# Patient Record
Sex: Male | Born: 1965 | Race: White | Hispanic: No | Marital: Married | Smoking: Current every day smoker
Health system: Southern US, Community
[De-identification: ages and names within clinical notes are randomized; demographics above are authoritative.]

## PROBLEM LIST (undated history)

## (undated) DIAGNOSIS — Z720 Tobacco use: Secondary | ICD-10-CM

## (undated) HISTORY — PX: KNEE ARTHROSCOPY: SUR90

## (undated) HISTORY — PX: REPLACEMENT TOTAL KNEE: SUR1224

---

## 2018-11-22 ENCOUNTER — Encounter (HOSPITAL_COMMUNITY)
Admission: EM | Disposition: A | Discharge status: Home / Self Care | Payer: Self-pay | Source: Home / Self Care | Attending: Internal Medicine

## 2018-11-22 ENCOUNTER — Inpatient Hospital Stay (HOSPITAL_COMMUNITY): Payer: BC Managed Care – PPO

## 2018-11-22 ENCOUNTER — Emergency Department (HOSPITAL_COMMUNITY): Payer: BC Managed Care – PPO

## 2018-11-22 ENCOUNTER — Other Ambulatory Visit: Payer: Self-pay

## 2018-11-22 ENCOUNTER — Encounter (HOSPITAL_COMMUNITY): Payer: Self-pay

## 2018-11-22 ENCOUNTER — Inpatient Hospital Stay (HOSPITAL_COMMUNITY): Payer: BC Managed Care – PPO | Admitting: Anesthesiology

## 2018-11-22 ENCOUNTER — Inpatient Hospital Stay (HOSPITAL_COMMUNITY)
Admission: EM | Admit: 2018-11-22 | Discharge: 2018-11-25 | DRG: 956 | Disposition: A | Discharge status: Home / Self Care | Payer: BC Managed Care – PPO | Attending: Internal Medicine | Admitting: Internal Medicine

## 2018-11-22 DIAGNOSIS — S72001A Fracture of unspecified part of neck of right femur, initial encounter for closed fracture: Secondary | ICD-10-CM

## 2018-11-22 DIAGNOSIS — S72061A Displaced articular fracture of head of right femur, initial encounter for closed fracture: Principal | ICD-10-CM | POA: Diagnosis present

## 2018-11-22 DIAGNOSIS — T1490XA Injury, unspecified, initial encounter: Secondary | ICD-10-CM

## 2018-11-22 DIAGNOSIS — Y92415 Exit ramp or entrance ramp of street or highway as the place of occurrence of the external cause: Secondary | ICD-10-CM | POA: Diagnosis not present

## 2018-11-22 DIAGNOSIS — S72001B Fracture of unspecified part of neck of right femur, initial encounter for open fracture type I or II: Secondary | ICD-10-CM | POA: Diagnosis not present

## 2018-11-22 DIAGNOSIS — Z820 Family history of epilepsy and other diseases of the nervous system: Secondary | ICD-10-CM

## 2018-11-22 DIAGNOSIS — M9689 Other intraoperative and postprocedural complications and disorders of the musculoskeletal system: Secondary | ICD-10-CM | POA: Diagnosis not present

## 2018-11-22 DIAGNOSIS — T148XXA Other injury of unspecified body region, initial encounter: Secondary | ICD-10-CM | POA: Diagnosis not present

## 2018-11-22 DIAGNOSIS — Z96659 Presence of unspecified artificial knee joint: Secondary | ICD-10-CM | POA: Diagnosis present

## 2018-11-22 DIAGNOSIS — S72059A Unspecified fracture of head of unspecified femur, initial encounter for closed fracture: Secondary | ICD-10-CM

## 2018-11-22 DIAGNOSIS — Y92234 Operating room of hospital as the place of occurrence of the external cause: Secondary | ICD-10-CM | POA: Diagnosis not present

## 2018-11-22 DIAGNOSIS — F17210 Nicotine dependence, cigarettes, uncomplicated: Secondary | ICD-10-CM | POA: Diagnosis present

## 2018-11-22 DIAGNOSIS — Z23 Encounter for immunization: Secondary | ICD-10-CM | POA: Diagnosis not present

## 2018-11-22 DIAGNOSIS — R17 Unspecified jaundice: Secondary | ICD-10-CM | POA: Diagnosis present

## 2018-11-22 DIAGNOSIS — Z880 Allergy status to penicillin: Secondary | ICD-10-CM | POA: Diagnosis not present

## 2018-11-22 DIAGNOSIS — S50811A Abrasion of right forearm, initial encounter: Secondary | ICD-10-CM | POA: Diagnosis present

## 2018-11-22 DIAGNOSIS — R402412 Glasgow coma scale score 13-15, at arrival to emergency department: Secondary | ICD-10-CM | POA: Diagnosis present

## 2018-11-22 DIAGNOSIS — S32421A Displaced fracture of posterior wall of right acetabulum, initial encounter for closed fracture: Secondary | ICD-10-CM

## 2018-11-22 DIAGNOSIS — Z5321 Procedure and treatment not carried out due to patient leaving prior to being seen by health care provider: Secondary | ICD-10-CM | POA: Diagnosis not present

## 2018-11-22 DIAGNOSIS — Z96641 Presence of right artificial hip joint: Secondary | ICD-10-CM | POA: Diagnosis not present

## 2018-11-22 DIAGNOSIS — Y838 Other surgical procedures as the cause of abnormal reaction of the patient, or of later complication, without mention of misadventure at the time of the procedure: Secondary | ICD-10-CM | POA: Diagnosis not present

## 2018-11-22 DIAGNOSIS — S73004A Unspecified dislocation of right hip, initial encounter: Secondary | ICD-10-CM

## 2018-11-22 DIAGNOSIS — Z720 Tobacco use: Secondary | ICD-10-CM | POA: Diagnosis not present

## 2018-11-22 DIAGNOSIS — I10 Essential (primary) hypertension: Secondary | ICD-10-CM

## 2018-11-22 DIAGNOSIS — Z9889 Other specified postprocedural states: Secondary | ICD-10-CM | POA: Diagnosis not present

## 2018-11-22 DIAGNOSIS — Y93C2 Activity, hand held interactive electronic device: Secondary | ICD-10-CM

## 2018-11-22 DIAGNOSIS — R739 Hyperglycemia, unspecified: Secondary | ICD-10-CM

## 2018-11-22 DIAGNOSIS — Z419 Encounter for procedure for purposes other than remedying health state, unspecified: Secondary | ICD-10-CM

## 2018-11-22 DIAGNOSIS — R509 Fever, unspecified: Secondary | ICD-10-CM | POA: Diagnosis present

## 2018-11-22 DIAGNOSIS — Z20828 Contact with and (suspected) exposure to other viral communicable diseases: Secondary | ICD-10-CM | POA: Diagnosis present

## 2018-11-22 HISTORY — PX: HIP CLOSED REDUCTION: SHX983

## 2018-11-22 HISTORY — DX: Tobacco use: Z72.0

## 2018-11-22 LAB — CBC
HCT: 42 % (ref 39.0–52.0)
Hemoglobin: 15 g/dL (ref 13.0–17.0)
MCH: 33.8 pg (ref 26.0–34.0)
MCHC: 35.7 g/dL (ref 30.0–36.0)
MCV: 94.6 fL (ref 80.0–100.0)
Platelets: 296 10*3/uL (ref 150–400)
RBC: 4.44 MIL/uL (ref 4.22–5.81)
RDW: 11.5 % (ref 11.5–15.5)
WBC: 5 10*3/uL (ref 4.0–10.5)
nRBC: 0 % (ref 0.0–0.2)

## 2018-11-22 LAB — COMPREHENSIVE METABOLIC PANEL
ALT: 24 U/L (ref 0–44)
AST: 20 U/L (ref 15–41)
Albumin: 3.9 g/dL (ref 3.5–5.0)
Alkaline Phosphatase: 69 U/L (ref 38–126)
Anion gap: 9 (ref 5–15)
BUN: 27 mg/dL — ABNORMAL HIGH (ref 6–20)
CO2: 23 mmol/L (ref 22–32)
Calcium: 8.9 mg/dL (ref 8.9–10.3)
Chloride: 107 mmol/L (ref 98–111)
Creatinine, Ser: 0.87 mg/dL (ref 0.61–1.24)
GFR calc Af Amer: >60 mL/min (ref >60)
GFR calc non Af Amer: >60 mL/min (ref >60)
Glucose, Bld: 139 mg/dL — ABNORMAL HIGH (ref 70–99)
Potassium: 4.1 mmol/L (ref 3.5–5.1)
Sodium: 139 mmol/L (ref 135–145)
Total Bilirubin: 2 mg/dL — ABNORMAL HIGH (ref 0.3–1.2)
Total Protein: 6.3 g/dL — ABNORMAL LOW (ref 6.5–8.1)

## 2018-11-22 LAB — I-STAT CHEM 8, ED
BUN: 29 mg/dL — ABNORMAL HIGH (ref 6–20)
Calcium, Ion: 1.18 mmol/L (ref 1.15–1.40)
Chloride: 105 mmol/L (ref 98–111)
Creatinine, Ser: 0.8 mg/dL (ref 0.61–1.24)
Glucose, Bld: 136 mg/dL — ABNORMAL HIGH (ref 70–99)
HCT: 42 % (ref 39.0–52.0)
Hemoglobin: 14.3 g/dL (ref 13.0–17.0)
Potassium: 4.1 mmol/L (ref 3.5–5.1)
Sodium: 140 mmol/L (ref 135–145)
TCO2: 23 mmol/L (ref 22–32)

## 2018-11-22 LAB — TYPE AND SCREEN
ABO/RH(D): O POS
Antibody Screen: NEGATIVE

## 2018-11-22 LAB — URINALYSIS, ROUTINE W REFLEX MICROSCOPIC
Bilirubin Urine: NEGATIVE
Glucose, UA: NEGATIVE mg/dL
Hgb urine dipstick: NEGATIVE
Ketones, ur: NEGATIVE mg/dL
Leukocytes,Ua: NEGATIVE
Nitrite: NEGATIVE
Protein, ur: NEGATIVE mg/dL
Specific Gravity, Urine: 1.039 — ABNORMAL HIGH (ref 1.005–1.030)
pH: 6 (ref 5.0–8.0)

## 2018-11-22 LAB — ABO/RH: ABO/RH(D): O POS

## 2018-11-22 LAB — PROTIME-INR
INR: 1 (ref 0.8–1.2)
Prothrombin Time: 13 seconds (ref 11.4–15.2)

## 2018-11-22 LAB — LACTIC ACID, PLASMA: Lactic Acid, Venous: 1.4 mmol/L (ref 0.5–1.9)

## 2018-11-22 LAB — HIV ANTIBODY (ROUTINE TESTING W REFLEX): HIV Screen 4th Generation wRfx: NONREACTIVE

## 2018-11-22 LAB — CDS SEROLOGY

## 2018-11-22 LAB — ETHANOL: Alcohol, Ethyl (B): <10 mg/dL (ref <10)

## 2018-11-22 LAB — VITAMIN D 25 HYDROXY (VIT D DEFICIENCY, FRACTURES): Vit D, 25-Hydroxy: 25.26 ng/mL — ABNORMAL LOW (ref 30–100)

## 2018-11-22 LAB — SARS CORONAVIRUS 2 BY RT PCR (HOSPITAL ORDER, PERFORMED IN ~~LOC~~ HOSPITAL LAB): SARS Coronavirus 2: NEGATIVE

## 2018-11-22 SURGERY — CLOSED REDUCTION, HIP
Anesthesia: General | Site: Hip | Laterality: Right

## 2018-11-22 MED ORDER — LACTATED RINGERS IV SOLN
INTRAVENOUS | Status: DC | PRN
  Administered 2018-11-22: 14:00:00 via INTRAVENOUS

## 2018-11-22 MED ORDER — ONDANSETRON HCL 4 MG/2ML IJ SOLN: 4.0000 mg | Freq: Once | INTRAMUSCULAR | Status: DC

## 2018-11-22 MED ORDER — HYDROMORPHONE HCL 1 MG/ML IJ SOLN: 0.5000 mg | Freq: Once | INTRAMUSCULAR | Status: DC

## 2018-11-22 MED ORDER — ACETAMINOPHEN 500 MG PO TABS
ORAL_TABLET | ORAL | Status: AC
  Administered 2018-11-22: 1000 mg via ORAL
  Filled 2018-11-22: qty 2

## 2018-11-22 MED ORDER — OXYCODONE HCL 5 MG/5ML PO SOLN: 5.0000 mg | Freq: Once | ORAL | Status: DC | PRN

## 2018-11-22 MED ORDER — PROMETHAZINE HCL 25 MG/ML IJ SOLN: 6.2500 mg | INTRAMUSCULAR | Status: DC | PRN

## 2018-11-22 MED ORDER — PROPOFOL 10 MG/ML IV BOLUS
INTRAVENOUS | Status: DC | PRN
  Administered 2018-11-22: 200 mg via INTRAVENOUS

## 2018-11-22 MED ORDER — OXYCODONE HCL 5 MG PO TABS
5.0000 mg | ORAL_TABLET | ORAL | Status: DC | PRN
  Administered 2018-11-22: 5 mg via ORAL
  Administered 2018-11-23 – 2018-11-24 (×3): 10 mg via ORAL
  Administered 2018-11-25: 5 mg via ORAL
  Filled 2018-11-22: qty 2
  Filled 2018-11-22: qty 1
  Filled 2018-11-22 (×2): qty 2
  Filled 2018-11-22: qty 1

## 2018-11-22 MED ORDER — TETANUS-DIPHTH-ACELL PERTUSSIS 5-2.5-18.5 LF-MCG/0.5 IM SUSP
0.5000 mL | Freq: Once | INTRAMUSCULAR | Status: AC
  Administered 2018-11-22: 0.5 mL via INTRAMUSCULAR
  Filled 2018-11-22: qty 0.5

## 2018-11-22 MED ORDER — CLINDAMYCIN PHOSPHATE 900 MG/50ML IV SOLN
900.0000 mg | INTRAVENOUS | Status: AC
  Administered 2018-11-22: 900 mg via INTRAVENOUS
  Filled 2018-11-22: qty 50

## 2018-11-22 MED ORDER — MIDAZOLAM HCL 2 MG/2ML IJ SOLN
INTRAMUSCULAR | Status: AC
  Filled 2018-11-22: qty 2

## 2018-11-22 MED ORDER — DEXAMETHASONE SODIUM PHOSPHATE 10 MG/ML IJ SOLN
INTRAMUSCULAR | Status: AC
  Filled 2018-11-22: qty 1

## 2018-11-22 MED ORDER — IOHEXOL 300 MG/ML  SOLN
100.0000 mL | Freq: Once | INTRAMUSCULAR | Status: AC | PRN
  Administered 2018-11-22: 100 mL via INTRAVENOUS

## 2018-11-22 MED ORDER — ACETAMINOPHEN 500 MG PO TABS
1000.0000 mg | ORAL_TABLET | Freq: Four times a day (QID) | ORAL | Status: DC
  Administered 2018-11-22 – 2018-11-24 (×4): 1000 mg via ORAL
  Filled 2018-11-22 (×5): qty 2

## 2018-11-22 MED ORDER — FENTANYL CITRATE (PF) 100 MCG/2ML IJ SOLN
INTRAMUSCULAR | Status: DC | PRN
  Administered 2018-11-22 (×4): 50 ug via INTRAVENOUS

## 2018-11-22 MED ORDER — ACETAMINOPHEN 500 MG PO TABS
1000.0000 mg | ORAL_TABLET | Freq: Once | ORAL | Status: AC
  Administered 2018-11-22: 13:00:00 1000 mg via ORAL

## 2018-11-22 MED ORDER — FENTANYL CITRATE (PF) 250 MCG/5ML IJ SOLN
INTRAMUSCULAR | Status: AC
  Filled 2018-11-22: qty 5

## 2018-11-22 MED ORDER — GABAPENTIN 100 MG PO CAPS
100.0000 mg | ORAL_CAPSULE | Freq: Three times a day (TID) | ORAL | Status: DC
  Administered 2018-11-22 – 2018-11-25 (×6): 100 mg via ORAL
  Filled 2018-11-22 (×7): qty 1

## 2018-11-22 MED ORDER — LACTATED RINGERS IV SOLN
Freq: Once | INTRAVENOUS | Status: AC
  Administered 2018-11-22: 13:00:00 via INTRAVENOUS

## 2018-11-22 MED ORDER — LIDOCAINE 2% (20 MG/ML) 5 ML SYRINGE
INTRAMUSCULAR | Status: AC
  Filled 2018-11-22: qty 5

## 2018-11-22 MED ORDER — LISINOPRIL 10 MG PO TABS
10.0000 mg | ORAL_TABLET | Freq: Every day | ORAL | Status: DC
  Administered 2018-11-23 – 2018-11-25 (×2): 10 mg via ORAL
  Filled 2018-11-22 (×2): qty 1

## 2018-11-22 MED ORDER — CHLORHEXIDINE GLUCONATE 4 % EX LIQD: 60.0000 mL | Freq: Once | CUTANEOUS | Status: DC

## 2018-11-22 MED ORDER — ONDANSETRON HCL 4 MG/2ML IJ SOLN
INTRAMUSCULAR | Status: AC
  Filled 2018-11-22: qty 2

## 2018-11-22 MED ORDER — METHOCARBAMOL 500 MG PO TABS
500.0000 mg | ORAL_TABLET | Freq: Four times a day (QID) | ORAL | Status: DC | PRN
  Administered 2018-11-23 – 2018-11-25 (×3): 500 mg via ORAL
  Filled 2018-11-22 (×3): qty 1

## 2018-11-22 MED ORDER — CEFAZOLIN SODIUM-DEXTROSE 2-4 GM/100ML-% IV SOLN
INTRAVENOUS | Status: AC
  Filled 2018-11-22: qty 100

## 2018-11-22 MED ORDER — ENOXAPARIN SODIUM 40 MG/0.4ML ~~LOC~~ SOLN
40.0000 mg | SUBCUTANEOUS | Status: DC
  Administered 2018-11-22 – 2018-11-23 (×2): 40 mg via SUBCUTANEOUS
  Filled 2018-11-22 (×3): qty 0.4

## 2018-11-22 MED ORDER — HYDROMORPHONE HCL 1 MG/ML IJ SOLN
0.5000 mg | Freq: Once | INTRAMUSCULAR | Status: AC
  Administered 2018-11-22: 0.5 mg via INTRAVENOUS
  Filled 2018-11-22: qty 1

## 2018-11-22 MED ORDER — HYDROMORPHONE HCL 1 MG/ML IJ SOLN
1.0000 mg | INTRAMUSCULAR | Status: DC | PRN
  Administered 2018-11-22: 1 mg via INTRAVENOUS
  Filled 2018-11-22: qty 1

## 2018-11-22 MED ORDER — LIDOCAINE 2% (20 MG/ML) 5 ML SYRINGE
INTRAMUSCULAR | Status: DC | PRN
  Administered 2018-11-22: 100 mg via INTRAVENOUS

## 2018-11-22 MED ORDER — POVIDONE-IODINE 10 % EX SWAB: 2.0000 "application " | Freq: Once | CUTANEOUS | Status: DC

## 2018-11-22 MED ORDER — EPHEDRINE SULFATE-NACL 50-0.9 MG/10ML-% IV SOSY
PREFILLED_SYRINGE | INTRAVENOUS | Status: DC | PRN
  Administered 2018-11-22 (×2): 10 mg via INTRAVENOUS

## 2018-11-22 MED ORDER — DEXAMETHASONE SODIUM PHOSPHATE 10 MG/ML IJ SOLN
INTRAMUSCULAR | Status: DC | PRN
  Administered 2018-11-22: 10 mg via INTRAVENOUS

## 2018-11-22 MED ORDER — MIDAZOLAM HCL 5 MG/5ML IJ SOLN
INTRAMUSCULAR | Status: DC | PRN
  Administered 2018-11-22: 2 mg via INTRAVENOUS

## 2018-11-22 MED ORDER — HYDROMORPHONE HCL 1 MG/ML IJ SOLN
INTRAMUSCULAR | Status: AC
  Filled 2018-11-22: qty 1

## 2018-11-22 MED ORDER — ONDANSETRON HCL 4 MG/2ML IJ SOLN
4.0000 mg | Freq: Once | INTRAMUSCULAR | Status: AC
  Administered 2018-11-22: 4 mg via INTRAVENOUS
  Filled 2018-11-22: qty 2

## 2018-11-22 MED ORDER — OXYCODONE HCL 5 MG PO TABS: 5.0000 mg | ORAL_TABLET | Freq: Once | ORAL | Status: DC | PRN

## 2018-11-22 MED ORDER — HYDROMORPHONE HCL 1 MG/ML IJ SOLN
1.0000 mg | INTRAMUSCULAR | Status: DC | PRN
  Administered 2018-11-23 – 2018-11-25 (×3): 1 mg via INTRAVENOUS
  Filled 2018-11-22 (×3): qty 1

## 2018-11-22 MED ORDER — NICOTINE 21 MG/24HR TD PT24: 21.0000 mg | MEDICATED_PATCH | Freq: Every day | TRANSDERMAL | Status: DC | PRN

## 2018-11-22 MED ORDER — HYDROMORPHONE HCL 1 MG/ML IJ SOLN
1.0000 mg | Freq: Once | INTRAMUSCULAR | Status: AC
  Administered 2018-11-22: 1 mg via INTRAVENOUS
  Filled 2018-11-22: qty 1

## 2018-11-22 MED ORDER — HYDROMORPHONE HCL 1 MG/ML IJ SOLN
0.2500 mg | INTRAMUSCULAR | Status: DC | PRN
  Administered 2018-11-22 (×2): 0.5 mg via INTRAVENOUS

## 2018-11-22 MED ORDER — HYDROCODONE-ACETAMINOPHEN 5-325 MG PO TABS: 1.0000 | ORAL_TABLET | Freq: Four times a day (QID) | ORAL | Status: DC | PRN

## 2018-11-22 MED ORDER — PROPOFOL 10 MG/ML IV BOLUS
INTRAVENOUS | Status: AC
  Filled 2018-11-22: qty 20

## 2018-11-22 MED ORDER — ONDANSETRON HCL 4 MG/2ML IJ SOLN
INTRAMUSCULAR | Status: DC | PRN
  Administered 2018-11-22: 4 mg via INTRAVENOUS

## 2018-11-22 MED ORDER — HYDROMORPHONE HCL 1 MG/ML IJ SOLN
INTRAMUSCULAR | Status: AC | PRN
  Administered 2018-11-22: 0.5 mg via INTRAVENOUS

## 2018-11-22 MED ORDER — SUCCINYLCHOLINE CHLORIDE 200 MG/10ML IV SOSY
PREFILLED_SYRINGE | INTRAVENOUS | Status: AC
  Filled 2018-11-22: qty 10

## 2018-11-22 MED ORDER — CEFAZOLIN SODIUM-DEXTROSE 2-4 GM/100ML-% IV SOLN
2.0000 g | Freq: Three times a day (TID) | INTRAVENOUS | Status: AC
  Administered 2018-11-22 – 2018-11-23 (×2): 2 g via INTRAVENOUS
  Filled 2018-11-22 (×3): qty 100

## 2018-11-22 MED ORDER — MORPHINE SULFATE (PF) 2 MG/ML IV SOLN: 0.5000 mg | INTRAVENOUS | Status: DC | PRN

## 2018-11-22 MED ORDER — SUCCINYLCHOLINE CHLORIDE 200 MG/10ML IV SOSY
PREFILLED_SYRINGE | INTRAVENOUS | Status: DC | PRN
  Administered 2018-11-22: 160 mg via INTRAVENOUS

## 2018-11-22 SURGICAL SUPPLY — 4 items
COVER BACK TABLE 60X90IN (DRAPES) ×3 IMPLANT
DRAPE SURG 17X23 STRL (DRAPES) ×2 IMPLANT
K-WIRE (WIRE) ×2 IMPLANT
TOWEL GREEN STERILE FF (TOWEL DISPOSABLE) ×2 IMPLANT

## 2018-11-22 NOTE — ED Notes (Signed)
Patient transported to CT 
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
## 2018-11-22 NOTE — Transfer of Care (Signed)
Immediate Anesthesia Transfer of Care Note  Patient: Willie Munoz [facility]  Procedure(s) Performed: CLOSED REDUCTION HIP WITH TRACTION PIN PLACEMENT (Right Hip)  Patient Location: PACU  Anesthesia Type:General  Level of Consciousness: oriented, drowsy and patient cooperative  Airway & Oxygen Therapy: Patient Spontanous Breathing and Patient connected to nasal cannula oxygen  Post-op Assessment: Report given to RN and Post -op Vital signs reviewed and stable  Post vital signs: Reviewed  Last Vitals:  Vitals Value Taken Time  BP    Temp    Pulse    Resp    SpO2      Last Pain:  Vitals:   11/22/18 1253  TempSrc:   PainSc: 8          Complications: No apparent anesthesia complications

## 2018-11-22 NOTE — Anesthesia Procedure Notes (Signed)
Procedure Name: Intubation Date/Time: 11/22/2018 1:52 PM Performed by: Jenne Campus, CRNA Pre-anesthesia Checklist: Patient identified, Emergency Drugs available, Suction available and Patient being monitored Patient Re-evaluated:Patient Re-evaluated prior to induction Oxygen Delivery Method: Circle System Utilized Preoxygenation: Pre-oxygenation with 100% oxygen Induction Type: IV induction, Cricoid Pressure applied and Rapid sequence Ventilation: Mask ventilation without difficulty Laryngoscope Size: Miller and 3 Grade View: Grade I Tube type: Oral Tube size: 7.5 mm Number of attempts: 1 Airway Equipment and Method: Stylet and Oral airway Placement Confirmation: ETT inserted through vocal cords under direct vision,  positive ETCO2 and breath sounds checked- equal and bilateral Secured at: 22 cm Tube secured with: Tape Dental Injury: Teeth and Oropharynx as per pre-operative assessment
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
## 2018-11-22 NOTE — Progress Notes (Signed)
Orthopedic Tech Progress Note Patient Details:  Willie Munoz [facility] Dec 15, 1965 [id number]  Musculoskeletal Traction Type of Traction: Skeletal (Balanced Suspension) Traction Location: RLE Traction Weight: 15 lbs   Post Interventions Patient Tolerated: Well Instructions Provided: Care of device   Braulio Bosch 11/22/2018, 3:35 PM

## 2018-11-22 NOTE — Anesthesia Postprocedure Evaluation (Signed)
Anesthesia Post Note  Patient: Willie Munoz  Procedure(s) Performed: CLOSED REDUCTION HIP WITH TRACTION PIN PLACEMENT (Right Hip)     Patient location during evaluation: PACU Anesthesia Type: General Level of consciousness: sedated and patient cooperative Pain management: pain level controlled Vital Signs Assessment: post-procedure vital signs reviewed and stable Respiratory status: spontaneous breathing Cardiovascular status: stable Anesthetic complications: no    Last Vitals:  Vitals:   11/22/18 1507 11/22/18 1522  BP: (!) 149/91 (!) 135/96  Pulse: 67 72  Resp: 13 12  Temp:    SpO2: 97% 98%    Last Pain:  Vitals:   11/22/18 1522  TempSrc:   PainSc: 2     LLE Motor Response: Purposeful movement;Responds to commands (11/22/18 1522) LLE Sensation: Full sensation (11/22/18 1522) RLE Motor Response: Purposeful movement;Responds to commands (11/22/18 1522) RLE Sensation: Full sensation (11/22/18 1522)      Nolon Nations
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
## 2018-11-22 NOTE — ED Notes (Signed)
Pt has returned from CT without incident 
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
## 2018-11-22 NOTE — Social Work (Signed)
CSW acknowledging consult for SNF placement. Will follow for therapy recommendations needed to best determine disposition/for insurance authorization.   Brunilda Eble, MSW, LCSWA [place] Clinical Social Work [phone]   
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
## 2018-11-22 NOTE — ED Notes (Signed)
Help get patient undress on the monitor did ekg shown to Dr Johnney Killian did manual blood pressure it was 150/100 notified RN of blood pressure patient is resting with nurse at bedside

## 2018-11-23 ENCOUNTER — Encounter (HOSPITAL_COMMUNITY): Payer: Self-pay | Admitting: Student

## 2018-11-23 DIAGNOSIS — S72001A Fracture of unspecified part of neck of right femur, initial encounter for closed fracture: Secondary | ICD-10-CM

## 2018-11-23 DIAGNOSIS — I10 Essential (primary) hypertension: Secondary | ICD-10-CM

## 2018-11-23 LAB — COMPREHENSIVE METABOLIC PANEL
ALT: 21 U/L (ref 0–44)
AST: 21 U/L (ref 15–41)
Albumin: 3.5 g/dL (ref 3.5–5.0)
Alkaline Phosphatase: 69 U/L (ref 38–126)
Anion gap: 8 (ref 5–15)
BUN: 26 mg/dL — ABNORMAL HIGH (ref 6–20)
CO2: 25 mmol/L (ref 22–32)
Calcium: 8.6 mg/dL — ABNORMAL LOW (ref 8.9–10.3)
Chloride: 105 mmol/L (ref 98–111)
Creatinine, Ser: 1.25 mg/dL — ABNORMAL HIGH (ref 0.61–1.24)
GFR calc Af Amer: >60 mL/min (ref >60)
GFR calc non Af Amer: >60 mL/min (ref >60)
Glucose, Bld: 166 mg/dL — ABNORMAL HIGH (ref 70–99)
Potassium: 4 mmol/L (ref 3.5–5.1)
Sodium: 138 mmol/L (ref 135–145)
Total Bilirubin: 1.8 mg/dL — ABNORMAL HIGH (ref 0.3–1.2)
Total Protein: 5.8 g/dL — ABNORMAL LOW (ref 6.5–8.1)

## 2018-11-23 LAB — CBC
HCT: 37.7 % — ABNORMAL LOW (ref 39.0–52.0)
Hemoglobin: 13.1 g/dL (ref 13.0–17.0)
MCH: 32.8 pg (ref 26.0–34.0)
MCHC: 34.7 g/dL (ref 30.0–36.0)
MCV: 94.5 fL (ref 80.0–100.0)
Platelets: 278 10*3/uL (ref 150–400)
RBC: 3.99 MIL/uL — ABNORMAL LOW (ref 4.22–5.81)
RDW: 11.3 % — ABNORMAL LOW (ref 11.5–15.5)
WBC: 12.1 10*3/uL — ABNORMAL HIGH (ref 4.0–10.5)
nRBC: 0 % (ref 0.0–0.2)

## 2018-11-23 LAB — SURGICAL PCR SCREEN
MRSA, PCR: NEGATIVE
Staphylococcus aureus: POSITIVE — AB

## 2018-11-23 MED ORDER — POVIDONE-IODINE 10 % EX SWAB: 2.0000 "application " | Freq: Once | CUTANEOUS | Status: DC

## 2018-11-23 MED ORDER — ENSURE PRE-SURGERY PO LIQD
296.0000 mL | Freq: Once | ORAL | Status: DC
  Filled 2018-11-23: qty 296

## 2018-11-23 MED ORDER — CHLORHEXIDINE GLUCONATE 4 % EX LIQD
60.0000 mL | Freq: Once | CUTANEOUS | Status: AC
  Administered 2018-11-23: 4 via TOPICAL
  Filled 2018-11-23: qty 60

## 2018-11-23 MED ORDER — CLINDAMYCIN PHOSPHATE 900 MG/50ML IV SOLN: 900.0000 mg | INTRAVENOUS | Status: DC

## 2018-11-23 MED ORDER — TRANEXAMIC ACID-NACL 1000-0.7 MG/100ML-% IV SOLN: 1000.0000 mg | INTRAVENOUS | Status: DC

## 2018-11-23 MED ORDER — LABETALOL HCL 5 MG/ML IV SOLN: 5.0000 mg | INTRAVENOUS | Status: DC | PRN
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
## 2018-11-23 NOTE — Plan of Care (Signed)
  Problem: Safety: Goal: Ability to remain free from injury will improve Outcome: Progressing   Problem: Skin Integrity: Goal: Risk for impaired skin integrity will decrease Outcome: Progressing   Problem: Pain Managment: Goal: General experience of comfort will improve Outcome: Progressing   

## 2018-11-23 NOTE — Plan of Care (Signed)
  Problem: Education: Goal: Knowledge of General Education information will improve Description: Including pain rating scale, medication(s)/side effects and non-pharmacologic comfort measures Outcome: Progressing   Problem: Clinical Measurements: Goal: Ability to maintain clinical measurements within normal limits will improve Outcome: Progressing Goal: Will remain free from infection Outcome: Progressing Goal: Respiratory complications will improve Outcome: Progressing Goal: Cardiovascular complication will be avoided Outcome: Progressing   Problem: Pain Managment: Goal: General experience of comfort will improve Outcome: Progressing   Problem: Safety: Goal: Ability to remain free from injury will improve Outcome: Progressing   

## 2018-11-24 ENCOUNTER — Inpatient Hospital Stay (HOSPITAL_COMMUNITY): Payer: BC Managed Care – PPO | Admitting: Anesthesiology

## 2018-11-24 ENCOUNTER — Inpatient Hospital Stay (HOSPITAL_COMMUNITY): Payer: BC Managed Care – PPO

## 2018-11-24 ENCOUNTER — Encounter (HOSPITAL_COMMUNITY): Payer: Self-pay | Admitting: Certified Registered Nurse Anesthetist

## 2018-11-24 ENCOUNTER — Encounter (HOSPITAL_COMMUNITY)
Admission: EM | Disposition: A | Discharge status: Home / Self Care | Payer: Self-pay | Source: Home / Self Care | Attending: Internal Medicine

## 2018-11-24 HISTORY — PX: TOTAL HIP ARTHROPLASTY: SHX124

## 2018-11-24 LAB — CBC
HCT: 36.2 % — ABNORMAL LOW (ref 39.0–52.0)
Hemoglobin: 12.8 g/dL — ABNORMAL LOW (ref 13.0–17.0)
MCH: 33.7 pg (ref 26.0–34.0)
MCHC: 35.4 g/dL (ref 30.0–36.0)
MCV: 95.3 fL (ref 80.0–100.0)
Platelets: 248 10*3/uL (ref 150–400)
RBC: 3.8 MIL/uL — ABNORMAL LOW (ref 4.22–5.81)
RDW: 11.8 % (ref 11.5–15.5)
WBC: 7.8 10*3/uL (ref 4.0–10.5)
nRBC: 0 % (ref 0.0–0.2)

## 2018-11-24 LAB — COMPREHENSIVE METABOLIC PANEL
ALT: 24 U/L (ref 0–44)
AST: 27 U/L (ref 15–41)
Albumin: 3.2 g/dL — ABNORMAL LOW (ref 3.5–5.0)
Alkaline Phosphatase: 63 U/L (ref 38–126)
Anion gap: 7 (ref 5–15)
BUN: 23 mg/dL — ABNORMAL HIGH (ref 6–20)
CO2: 25 mmol/L (ref 22–32)
Calcium: 8.3 mg/dL — ABNORMAL LOW (ref 8.9–10.3)
Chloride: 106 mmol/L (ref 98–111)
Creatinine, Ser: 1 mg/dL (ref 0.61–1.24)
GFR calc Af Amer: >60 mL/min (ref >60)
GFR calc non Af Amer: >60 mL/min (ref >60)
Glucose, Bld: 119 mg/dL — ABNORMAL HIGH (ref 70–99)
Potassium: 3.8 mmol/L (ref 3.5–5.1)
Sodium: 138 mmol/L (ref 135–145)
Total Bilirubin: 1.9 mg/dL — ABNORMAL HIGH (ref 0.3–1.2)
Total Protein: 5.7 g/dL — ABNORMAL LOW (ref 6.5–8.1)

## 2018-11-24 LAB — HEMOGLOBIN A1C
Hgb A1c MFr Bld: 5.3 % (ref 4.8–5.6)
Mean Plasma Glucose: 105.41 mg/dL

## 2018-11-24 SURGERY — ARTHROPLASTY, HIP, TOTAL, ANTERIOR APPROACH
Anesthesia: General | Site: Hip | Laterality: Right

## 2018-11-24 MED ORDER — PHENOL 1.4 % MT LIQD: 1.0000 | OROMUCOSAL | Status: DC | PRN

## 2018-11-24 MED ORDER — ROCURONIUM BROMIDE 10 MG/ML (PF) SYRINGE
PREFILLED_SYRINGE | INTRAVENOUS | Status: DC | PRN
  Administered 2018-11-24: 60 mg via INTRAVENOUS
  Administered 2018-11-24 (×2): 20 mg via INTRAVENOUS

## 2018-11-24 MED ORDER — FENTANYL CITRATE (PF) 250 MCG/5ML IJ SOLN
INTRAMUSCULAR | Status: AC
  Filled 2018-11-24: qty 5

## 2018-11-24 MED ORDER — BUPIVACAINE-EPINEPHRINE 0.5% -1:200000 IJ SOLN
INTRAMUSCULAR | Status: AC
  Filled 2018-11-24: qty 1

## 2018-11-24 MED ORDER — TRANEXAMIC ACID-NACL 1000-0.7 MG/100ML-% IV SOLN
1000.0000 mg | INTRAVENOUS | Status: AC
  Administered 2018-11-24: 1000 mg via INTRAVENOUS

## 2018-11-24 MED ORDER — NON FORMULARY: Status: DC | PRN

## 2018-11-24 MED ORDER — MUPIROCIN 2 % EX OINT
1.0000 "application " | TOPICAL_OINTMENT | Freq: Two times a day (BID) | CUTANEOUS | Status: DC
  Administered 2018-11-24 (×2): 1 via NASAL

## 2018-11-24 MED ORDER — ONDANSETRON HCL 4 MG/2ML IJ SOLN
4.0000 mg | Freq: Once | INTRAMUSCULAR | Status: AC | PRN
  Administered 2018-11-24: 4 mg via INTRAVENOUS

## 2018-11-24 MED ORDER — LIDOCAINE 2% (20 MG/ML) 5 ML SYRINGE
INTRAMUSCULAR | Status: DC | PRN
  Administered 2018-11-24: 100 mg via INTRAVENOUS

## 2018-11-24 MED ORDER — KETOROLAC TROMETHAMINE 30 MG/ML IJ SOLN
INTRAMUSCULAR | Status: DC | PRN
  Administered 2018-11-24: 30 mg

## 2018-11-24 MED ORDER — METOCLOPRAMIDE HCL 5 MG/ML IJ SOLN: 5.0000 mg | Freq: Three times a day (TID) | INTRAMUSCULAR | Status: DC | PRN

## 2018-11-24 MED ORDER — CHLORHEXIDINE GLUCONATE CLOTH 2 % EX PADS
6.0000 | MEDICATED_PAD | Freq: Every day | CUTANEOUS | Status: DC
  Administered 2018-11-24: 6 via TOPICAL

## 2018-11-24 MED ORDER — MIDAZOLAM HCL 2 MG/2ML IJ SOLN
INTRAMUSCULAR | Status: AC
  Filled 2018-11-24: qty 2

## 2018-11-24 MED ORDER — PROPOFOL 10 MG/ML IV BOLUS
INTRAVENOUS | Status: AC
  Filled 2018-11-24: qty 20

## 2018-11-24 MED ORDER — KETOROLAC TROMETHAMINE 30 MG/ML IJ SOLN
INTRAMUSCULAR | Status: AC
  Filled 2018-11-24: qty 1

## 2018-11-24 MED ORDER — ONDANSETRON HCL 4 MG/2ML IJ SOLN
INTRAMUSCULAR | Status: AC
  Filled 2018-11-24: qty 2

## 2018-11-24 MED ORDER — PROPOFOL 10 MG/ML IV BOLUS
INTRAVENOUS | Status: DC | PRN
  Administered 2018-11-24: 200 mg via INTRAVENOUS

## 2018-11-24 MED ORDER — CEFAZOLIN SODIUM-DEXTROSE 2-3 GM-%(50ML) IV SOLR
INTRAVENOUS | Status: DC | PRN
  Administered 2018-11-24: 2 g via INTRAVENOUS

## 2018-11-24 MED ORDER — LACTATED RINGERS IV SOLN
INTRAVENOUS | Status: DC
  Administered 2018-11-24 (×2): via INTRAVENOUS

## 2018-11-24 MED ORDER — TRANEXAMIC ACID-NACL 1000-0.7 MG/100ML-% IV SOLN
INTRAVENOUS | Status: AC
  Filled 2018-11-24: qty 100

## 2018-11-24 MED ORDER — ACETAMINOPHEN 10 MG/ML IV SOLN
INTRAVENOUS | Status: AC
  Filled 2018-11-24: qty 100

## 2018-11-24 MED ORDER — ENOXAPARIN SODIUM 40 MG/0.4ML ~~LOC~~ SOLN
40.0000 mg | SUBCUTANEOUS | Status: DC
  Administered 2018-11-25: 40 mg via SUBCUTANEOUS
  Filled 2018-11-24: qty 0.4

## 2018-11-24 MED ORDER — DEXAMETHASONE SODIUM PHOSPHATE 10 MG/ML IJ SOLN
INTRAMUSCULAR | Status: AC
  Filled 2018-11-24: qty 1

## 2018-11-24 MED ORDER — DEXAMETHASONE SODIUM PHOSPHATE 10 MG/ML IJ SOLN
INTRAMUSCULAR | Status: DC | PRN
  Administered 2018-11-24: 10 mg via INTRAVENOUS

## 2018-11-24 MED ORDER — CEFAZOLIN SODIUM-DEXTROSE 2-4 GM/100ML-% IV SOLN
2.0000 g | Freq: Four times a day (QID) | INTRAVENOUS | Status: AC
  Administered 2018-11-24 (×2): 2 g via INTRAVENOUS
  Filled 2018-11-24 (×2): qty 100

## 2018-11-24 MED ORDER — MIDAZOLAM HCL 5 MG/5ML IJ SOLN
INTRAMUSCULAR | Status: DC | PRN
  Administered 2018-11-24: 2 mg via INTRAVENOUS

## 2018-11-24 MED ORDER — SODIUM CHLORIDE (PF) 0.9 % IJ SOLN
INTRAMUSCULAR | Status: DC | PRN
  Administered 2018-11-24 (×3): 10 mL

## 2018-11-24 MED ORDER — SODIUM CHLORIDE 0.9 % IR SOLN
Status: DC | PRN
  Administered 2018-11-24: 3000 mL

## 2018-11-24 MED ORDER — PROPOFOL 10 MG/ML IV BOLUS
INTRAVENOUS | Status: AC
  Filled 2018-11-24: qty 40

## 2018-11-24 MED ORDER — BUPIVACAINE-EPINEPHRINE (PF) 0.5% -1:200000 IJ SOLN
INTRAMUSCULAR | Status: DC | PRN
  Administered 2018-11-24: 30 mL

## 2018-11-24 MED ORDER — ROCURONIUM BROMIDE 10 MG/ML (PF) SYRINGE
PREFILLED_SYRINGE | INTRAVENOUS | Status: AC
  Filled 2018-11-24: qty 10

## 2018-11-24 MED ORDER — ALBUMIN HUMAN 5 % IV SOLN
INTRAVENOUS | Status: DC | PRN
  Administered 2018-11-24: 15:00:00 via INTRAVENOUS

## 2018-11-24 MED ORDER — ONDANSETRON HCL 4 MG/2ML IJ SOLN
INTRAMUSCULAR | Status: DC | PRN
  Administered 2018-11-24: 4 mg via INTRAVENOUS

## 2018-11-24 MED ORDER — HYDROMORPHONE HCL 1 MG/ML IJ SOLN
INTRAMUSCULAR | Status: AC
  Filled 2018-11-24: qty 1

## 2018-11-24 MED ORDER — STERILE WATER FOR IRRIGATION IR SOLN
Status: DC | PRN
  Administered 2018-11-24: 1000 mL

## 2018-11-24 MED ORDER — SODIUM CHLORIDE 0.9 % IV SOLN
INTRAVENOUS | Status: AC | PRN
  Administered 2018-11-24: 500 mL

## 2018-11-24 MED ORDER — OXYCODONE HCL 5 MG/5ML PO SOLN: 5.0000 mg | Freq: Once | ORAL | Status: DC | PRN

## 2018-11-24 MED ORDER — 0.9 % SODIUM CHLORIDE (POUR BTL) OPTIME
TOPICAL | Status: DC | PRN
  Administered 2018-11-24: 1000 mL

## 2018-11-24 MED ORDER — FENTANYL CITRATE (PF) 100 MCG/2ML IJ SOLN
INTRAMUSCULAR | Status: DC | PRN
  Administered 2018-11-24 (×2): 100 ug via INTRAVENOUS
  Administered 2018-11-24: 50 ug via INTRAVENOUS

## 2018-11-24 MED ORDER — METOCLOPRAMIDE HCL 5 MG PO TABS: 5.0000 mg | ORAL_TABLET | Freq: Three times a day (TID) | ORAL | Status: DC | PRN

## 2018-11-24 MED ORDER — MENTHOL 3 MG MT LOZG: 1.0000 | LOZENGE | OROMUCOSAL | Status: DC | PRN

## 2018-11-24 MED ORDER — CEFAZOLIN SODIUM 1 G IJ SOLR
INTRAMUSCULAR | Status: AC
  Filled 2018-11-24: qty 20

## 2018-11-24 MED ORDER — HYDROMORPHONE HCL 1 MG/ML IJ SOLN
0.2500 mg | INTRAMUSCULAR | Status: DC | PRN
  Administered 2018-11-24 (×2): 0.5 mg via INTRAVENOUS

## 2018-11-24 MED ORDER — ACETAMINOPHEN 10 MG/ML IV SOLN
1000.0000 mg | Freq: Once | INTRAVENOUS | Status: AC
  Administered 2018-11-24: 1000 mg via INTRAVENOUS

## 2018-11-24 MED ORDER — SUGAMMADEX SODIUM 200 MG/2ML IV SOLN
INTRAVENOUS | Status: DC | PRN
  Administered 2018-11-24: 175 mg via INTRAVENOUS

## 2018-11-24 MED ORDER — ONDANSETRON HCL 4 MG/2ML IJ SOLN: 4.0000 mg | Freq: Four times a day (QID) | INTRAMUSCULAR | Status: DC | PRN

## 2018-11-24 MED ORDER — DOCUSATE SODIUM 100 MG PO CAPS
100.0000 mg | ORAL_CAPSULE | Freq: Two times a day (BID) | ORAL | Status: DC
  Administered 2018-11-24 – 2018-11-25 (×2): 100 mg via ORAL
  Filled 2018-11-24 (×2): qty 1

## 2018-11-24 MED ORDER — ONDANSETRON HCL 4 MG PO TABS: 4.0000 mg | ORAL_TABLET | Freq: Four times a day (QID) | ORAL | Status: DC | PRN

## 2018-11-24 MED ORDER — SENNA 8.6 MG PO TABS
1.0000 | ORAL_TABLET | Freq: Two times a day (BID) | ORAL | Status: DC
  Administered 2018-11-24 – 2018-11-25 (×2): 8.6 mg via ORAL
  Filled 2018-11-24 (×2): qty 1

## 2018-11-24 MED ORDER — LIDOCAINE 2% (20 MG/ML) 5 ML SYRINGE
INTRAMUSCULAR | Status: AC
  Filled 2018-11-24: qty 5

## 2018-11-24 MED ORDER — OXYCODONE HCL 5 MG PO TABS: 5.0000 mg | ORAL_TABLET | Freq: Once | ORAL | Status: DC | PRN

## 2018-11-24 SURGICAL SUPPLY — 74 items
ALCOHOL 70% 16 OZ (MISCELLANEOUS) ×3 IMPLANT
BLADE CLIPPER SURG (BLADE) IMPLANT
CHLORAPREP W/TINT 26 (MISCELLANEOUS) ×5 IMPLANT
COVER SURGICAL LIGHT HANDLE (MISCELLANEOUS) ×3 IMPLANT
COVER WAND RF STERILE (DRAPES) ×3 IMPLANT
DERMABOND ADVANCED (GAUZE/BANDAGES/DRESSINGS) ×2
DERMABOND ADVANCED .7 DNX12 (GAUZE/BANDAGES/DRESSINGS) ×2 IMPLANT
DRAPE C-ARM 42X72 X-RAY (DRAPES) ×3 IMPLANT
DRAPE STERI IOBAN 125X83 (DRAPES) ×3 IMPLANT
DRAPE U-SHAPE 47X51 STRL (DRAPES) ×9 IMPLANT
DRSG AQUACEL AG ADV 3.5X10 (GAUZE/BANDAGES/DRESSINGS) ×3 IMPLANT
DRSG MEPILEX BORDER 4X4 (GAUZE/BANDAGES/DRESSINGS) ×2 IMPLANT
DRSG MEPILEX BORDER 4X8 (GAUZE/BANDAGES/DRESSINGS) ×2 IMPLANT
ELECT BLADE 4.0 EZ CLEAN MEGAD (MISCELLANEOUS) ×3
ELECT PENCIL ROCKER SW 15FT (MISCELLANEOUS) ×3 IMPLANT
ELECT REM PT RETURN 9FT ADLT (ELECTROSURGICAL) ×3
ELECTRODE BLDE 4.0 EZ CLN MEGD (MISCELLANEOUS) ×1 IMPLANT
ELECTRODE REM PT RTRN 9FT ADLT (ELECTROSURGICAL) ×1 IMPLANT
EVACUATOR 1/8 PVC DRAIN (DRAIN) IMPLANT
FACESHIELD WRAPAROUND (MASK) ×3 IMPLANT
FACESHIELD WRAPAROUND OR TEAM (MASK) IMPLANT
GLOVE BIO SURGEON STRL SZ8.5 (GLOVE) ×10 IMPLANT
GLOVE BIOGEL PI IND STRL 8.5 (GLOVE) ×1 IMPLANT
GLOVE BIOGEL PI INDICATOR 8.5 (GLOVE) ×4
GOWN STRL REUS W/ TWL LRG LVL3 (GOWN DISPOSABLE) ×2 IMPLANT
GOWN STRL REUS W/TWL 2XL LVL3 (GOWN DISPOSABLE) ×5 IMPLANT
GOWN STRL REUS W/TWL LRG LVL3 (GOWN DISPOSABLE) ×6
HANDPIECE INTERPULSE COAX TIP (DISPOSABLE) ×2
HEAD BIOLOX HIP 36/-2.5 (Joint) IMPLANT
HIP BIOLOX HD 36/-2.5 (Joint) ×3 IMPLANT
HOOD PEEL AWAY FACE SHEILD DIS (HOOD) ×6 IMPLANT
HOOD PEEL AWAY FLYTE STAYCOOL (MISCELLANEOUS) ×4 IMPLANT
INSERT TRIDENT POLY 36MM 0DEG (Insert) ×2 IMPLANT
JET LAVAGE IRRISEPT WOUND (IRRIGATION / IRRIGATOR) ×3
KIT BASIN OR (CUSTOM PROCEDURE TRAY) ×3 IMPLANT
KIT TURNOVER KIT B (KITS) ×3 IMPLANT
LAVAGE JET IRRISEPT WOUND (IRRIGATION / IRRIGATOR) ×1 IMPLANT
MANIFOLD NEPTUNE II (INSTRUMENTS) ×3 IMPLANT
MARKER SKIN DUAL TIP RULER LAB (MISCELLANEOUS) ×6 IMPLANT
NDL 18GX1X1/2 (RX/OR ONLY) (NEEDLE) IMPLANT
NDL HYPO 25GX1X1/2 BEV (NEEDLE) IMPLANT
NDL SPNL 18GX3.5 QUINCKE PK (NEEDLE) ×1 IMPLANT
NEEDLE 18GX1X1/2 (RX/OR ONLY) (NEEDLE) ×3 IMPLANT
NEEDLE HYPO 25GX1X1/2 BEV (NEEDLE) ×3 IMPLANT
NEEDLE SPNL 18GX3.5 QUINCKE PK (NEEDLE) ×3 IMPLANT
NS IRRIG 1000ML POUR BTL (IV SOLUTION) ×3 IMPLANT
PACK TOTAL JOINT (CUSTOM PROCEDURE TRAY) ×3 IMPLANT
PACK UNIVERSAL I (CUSTOM PROCEDURE TRAY) ×3 IMPLANT
PAD ARMBOARD 7.5X6 YLW CONV (MISCELLANEOUS) ×6 IMPLANT
SAW OSC TIP CART 19.5X105X1.3 (SAW) ×3 IMPLANT
SCREW HEX LP 6.5X30 (Screw) ×2 IMPLANT
SCREW HEX LP 6.5X35 (Screw) ×2 IMPLANT
SEALER BIPOLAR AQUA 6.0 (INSTRUMENTS) IMPLANT
SET HNDPC FAN SPRY TIP SCT (DISPOSABLE) ×1 IMPLANT
SHELL ACETABUL CLUSTER SZ 54 (Shell) ×2 IMPLANT
SOL PREP POV-IOD 4OZ 10% (MISCELLANEOUS) ×3 IMPLANT
STEM ACCOLADE II SZ5 (Stem) ×2 IMPLANT
SUT ETHIBOND NAB CT1 #1 30IN (SUTURE) ×6 IMPLANT
SUT MNCRL AB 3-0 PS2 18 (SUTURE) ×3 IMPLANT
SUT MON AB 2-0 CT1 36 (SUTURE) ×3 IMPLANT
SUT VIC AB 1 CT1 27 (SUTURE) ×2
SUT VIC AB 1 CT1 27XBRD ANBCTR (SUTURE) ×1 IMPLANT
SUT VIC AB 2-0 CT1 27 (SUTURE) ×2
SUT VIC AB 2-0 CT1 TAPERPNT 27 (SUTURE) ×1 IMPLANT
SUT VLOC 180 0 24IN GS25 (SUTURE) ×3 IMPLANT
SYR 3ML LL SCALE MARK (SYRINGE) ×2 IMPLANT
SYR 50ML LL SCALE MARK (SYRINGE) ×3 IMPLANT
SYR CONTROL 10ML LL (SYRINGE) ×2 IMPLANT
TOWEL GREEN STERILE (TOWEL DISPOSABLE) ×3 IMPLANT
TOWEL GREEN STERILE FF (TOWEL DISPOSABLE) ×3 IMPLANT
TRAY CATH 16FR W/PLASTIC CATH (SET/KITS/TRAYS/PACK) IMPLANT
TRAY FOLEY W/BAG SLVR 16FR (SET/KITS/TRAYS/PACK)
TRAY FOLEY W/BAG SLVR 16FR ST (SET/KITS/TRAYS/PACK) IMPLANT
WATER STERILE IRR 1000ML POUR (IV SOLUTION) ×9 IMPLANT
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
## 2018-11-24 NOTE — Anesthesia Procedure Notes (Signed)
Procedure Name: Intubation Date/Time: 11/24/2018 12:57 PM Performed by: Jenne Campus, CRNA Pre-anesthesia Checklist: Patient identified, Emergency Drugs available, Suction available and Patient being monitored Patient Re-evaluated:Patient Re-evaluated prior to induction Oxygen Delivery Method: Circle System Utilized Preoxygenation: Pre-oxygenation with 100% oxygen Induction Type: IV induction Ventilation: Mask ventilation without difficulty Laryngoscope Size: Miller and 3 Grade View: Grade I Tube type: Oral Tube size: 7.5 mm Number of attempts: 1 Airway Equipment and Method: Stylet and Oral airway Placement Confirmation: ETT inserted through vocal cords under direct vision,  positive ETCO2 and breath sounds checked- equal and bilateral Secured at: 23 cm Tube secured with: Tape Dental Injury: Teeth and Oropharynx as per pre-operative assessment

## 2018-11-24 NOTE — Discharge Instructions (Signed)
°Dr. Carrel Leather °Joint Replacement Specialist °[facility] °[address] °[place], [place] [address] °[phone] ° ° °TOTAL HIP REPLACEMENT POSTOPERATIVE DIRECTIONS ° ° ° °Hip Rehabilitation, Guidelines Following Surgery  ° °WEIGHT BEARING °Weight bearing as tolerated with assist device (walker, cane, etc) as directed, use it as long as suggested by your surgeon or therapist, typically at least 4-6 weeks. ° °The results of a hip operation are greatly improved after range of motion and muscle strengthening exercises. Follow all safety measures which are given to protect your hip. If any of these exercises cause increased pain or swelling in your joint, decrease the amount until you are comfortable again. Then slowly increase the exercises. Call your caregiver if you have problems or questions.  ° °HOME CARE INSTRUCTIONS  °Most of the following instructions are designed to prevent the dislocation of your new hip.  °Remove items at home which could result in a fall. This includes throw rugs or furniture in walking pathways.  °Continue medications as instructed at time of discharge. °· You may have some home medications which will be placed on hold until you complete the course of blood thinner medication. °· You may start showering once you are discharged home. Do not remove your dressing. °Do not put on socks or shoes without following the instructions of your caregivers.   °Sit on chairs with arms. Use the chair arms to help push yourself up when arising.  °Arrange for the use of a toilet seat elevator so you are not sitting low.  °· Walk with walker as instructed.  °You may resume a sexual relationship in one month or when given the OK by your caregiver.  °Use walker as long as suggested by your caregivers.  °You may put full weight on your legs and walk as much as is comfortable. °Avoid periods of inactivity such as sitting longer than an hour when not asleep. This helps prevent  blood clots.  °You may return to work once you are cleared by your surgeon.  °Do not drive a car for 6 weeks or until released by your surgeon.  °Do not drive while taking narcotics.  °Wear elastic stockings for two weeks following surgery during the day but you may remove then at night.  °Make sure you keep all of your appointments after your operation with all of your doctors and caregivers. You should call the office at the above phone number and make an appointment for approximately two weeks after the date of your surgery. °Please pick up a stool softener and laxative for home use as long as you are requiring pain medications. °· ICE to the affected hip every three hours for 30 minutes at a time and then as needed for pain and swelling. Continue to use ice on the hip for pain and swelling from surgery. You may notice swelling that will progress down to the foot and ankle.  This is normal after surgery.  Elevate the leg when you are not up walking on it.   °It is important for you to complete the blood thinner medication as prescribed by your doctor. °· Continue to use the breathing machine which will help keep your temperature down.  It is common for your temperature to cycle up and down following surgery, especially at night when you are not up moving around and exerting yourself.  The breathing machine keeps your lungs expanded and your temperature down. ° °RANGE OF MOTION AND STRENGTHENING EXERCISES  °These exercises are   designed to help you keep full movement of your hip joint. Follow your caregiver's or physical therapist's instructions. Perform all exercises about fifteen times, three times per day or as directed. Exercise both hips, even if you have had only one joint replacement. These exercises can be done on a training (exercise) mat, on the floor, on a table or on a bed. Use whatever works the best and is most comfortable for you. Use music or television while you are exercising so that the exercises  are a pleasant break in your day. This will make your life better with the exercises acting as a break in routine you can look forward to.  °Lying on your back, slowly slide your foot toward your buttocks, raising your knee up off the floor. Then slowly slide your foot back down until your leg is straight again.  °Lying on your back spread your legs as far apart as you can without causing discomfort.  °Lying on your side, raise your upper leg and foot straight up from the floor as far as is comfortable. Slowly lower the leg and repeat.  °Lying on your back, tighten up the muscle in the front of your thigh (quadriceps muscles). You can do this by keeping your leg straight and trying to raise your heel off the floor. This helps strengthen the largest muscle supporting your knee.  °Lying on your back, tighten up the muscles of your buttocks both with the legs straight and with the knee bent at a comfortable angle while keeping your heel on the floor.  ° °SKILLED REHAB INSTRUCTIONS: °If the patient is transferred to a skilled rehab facility following release from the hospital, a list of the current medications will be sent to the facility for the patient to continue.  When discharged from the skilled rehab facility, please have the facility set up the patient's Home Health Physical Therapy prior to being released. Also, the skilled facility will be responsible for providing the patient with their medications at time of release from the facility to include their pain medication and their blood thinner medication. If the patient is still at the rehab facility at time of the two week follow up appointment, the skilled rehab facility will also need to assist the patient in arranging follow up appointment in our office and any transportation needs. ° °MAKE SURE YOU:  °Understand these instructions.  °Will watch your condition.  °Will get help right away if you are not doing well or get worse. ° °Pick up stool softner and  laxative for home use following surgery while on pain medications. °Do not remove your dressing. °The dressing is waterproof--it is OK to take showers. °Continue to use ice for pain and swelling after surgery. °Do not use any lotions or creams on the incision until instructed by your surgeon. °Total Hip Protocol. ° ° °
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
## 2018-11-24 NOTE — Transfer of Care (Signed)
Immediate Anesthesia Transfer of Care Note  Patient: Willie Munoz  Procedure(s) Performed: TOTAL HIP ARTHROPLASTY ANTERIOR APPROACH (Right Hip)  Patient Location: PACU  Anesthesia Type:General  Level of Consciousness: oriented, drowsy and patient cooperative  Airway & Oxygen Therapy: Patient Spontanous Breathing and Patient connected to nasal cannula oxygen  Post-op Assessment: Report given to RN and Post -op Vital signs reviewed and stable  Post vital signs: Reviewed  Last Vitals:  Vitals Value Taken Time  BP 151/74 11/24/18 1557  Temp    Pulse 84 11/24/18 1558  Resp 18 11/24/18 1558  SpO2 97 % 11/24/18 1558  Vitals shown include unvalidated device data.  Last Pain:  Vitals:   11/24/18 1101  TempSrc:   PainSc: 2       Patients Stated Pain Goal: 2 (01/17/23 4695)  Complications: No apparent anesthesia complications

## 2018-11-24 NOTE — Anesthesia Postprocedure Evaluation (Signed)
Anesthesia Post Note  Patient: Willie Munoz [facility]  Procedure(s) Performed: TOTAL HIP ARTHROPLASTY ANTERIOR APPROACH (Right Hip)     Patient location during evaluation: PACU Anesthesia Type: General Level of consciousness: awake and alert, oriented and patient cooperative Pain management: pain level controlled Vital Signs Assessment: post-procedure vital signs reviewed and stable Respiratory status: spontaneous breathing, nonlabored ventilation and respiratory function stable Cardiovascular status: blood pressure returned to baseline and stable Postop Assessment: no apparent nausea or vomiting Anesthetic complications: no    Last Vitals:  Vitals:   11/24/18 1645 11/24/18 1646  BP:  131/75  Pulse: 69 68  Resp: 15 14  Temp:  (!) 36.3 C  SpO2: 94% 95%    Last Pain:  Vitals:   11/24/18 1615  TempSrc:   PainSc: 0-No pain        RLE Motor Response: Purposeful movement (11/24/18 1645) RLE Sensation: Full sensation (11/24/18 [place])      [name]

## 2018-11-25 ENCOUNTER — Emergency Department (HOSPITAL_COMMUNITY): Payer: BC Managed Care – PPO

## 2018-11-25 ENCOUNTER — Other Ambulatory Visit: Payer: Self-pay

## 2018-11-25 ENCOUNTER — Encounter (HOSPITAL_COMMUNITY): Payer: Self-pay | Admitting: Emergency Medicine

## 2018-11-25 ENCOUNTER — Emergency Department (HOSPITAL_COMMUNITY)
Admission: EM | Admit: 2018-11-25 | Discharge: 2018-11-26 | Disposition: A | Discharge status: Home / Self Care | Payer: BC Managed Care – PPO | Attending: Emergency Medicine | Admitting: Emergency Medicine

## 2018-11-25 DIAGNOSIS — Z96641 Presence of right artificial hip joint: Secondary | ICD-10-CM | POA: Diagnosis not present

## 2018-11-25 DIAGNOSIS — Z419 Encounter for procedure for purposes other than remedying health state, unspecified: Secondary | ICD-10-CM

## 2018-11-25 DIAGNOSIS — T148XXA Other injury of unspecified body region, initial encounter: Secondary | ICD-10-CM

## 2018-11-25 DIAGNOSIS — R509 Fever, unspecified: Secondary | ICD-10-CM | POA: Diagnosis present

## 2018-11-25 DIAGNOSIS — Z5321 Procedure and treatment not carried out due to patient leaving prior to being seen by health care provider: Secondary | ICD-10-CM | POA: Insufficient documentation

## 2018-11-25 DIAGNOSIS — Z9889 Other specified postprocedural states: Secondary | ICD-10-CM | POA: Diagnosis not present

## 2018-11-25 LAB — CBC WITH DIFFERENTIAL/PLATELET
Abs Immature Granulocytes: 0.02 10*3/uL (ref 0.00–0.07)
Basophils Absolute: 0 10*3/uL (ref 0.0–0.1)
Basophils Relative: 0 %
Eosinophils Absolute: 0 10*3/uL (ref 0.0–0.5)
Eosinophils Relative: 1 %
HCT: 28.6 % — ABNORMAL LOW (ref 39.0–52.0)
Hemoglobin: 10.2 g/dL — ABNORMAL LOW (ref 13.0–17.0)
Immature Granulocytes: 0 %
Lymphocytes Relative: 14 %
Lymphs Abs: 1 10*3/uL (ref 0.7–4.0)
MCH: 34.3 pg — ABNORMAL HIGH (ref 26.0–34.0)
MCHC: 35.7 g/dL (ref 30.0–36.0)
MCV: 96.3 fL (ref 80.0–100.0)
Monocytes Absolute: 0.8 10*3/uL (ref 0.1–1.0)
Monocytes Relative: 11 %
Neutro Abs: 5.2 10*3/uL (ref 1.7–7.7)
Neutrophils Relative %: 74 %
Platelets: 228 10*3/uL (ref 150–400)
RBC: 2.97 MIL/uL — ABNORMAL LOW (ref 4.22–5.81)
RDW: 11.5 % (ref 11.5–15.5)
WBC: 7.1 10*3/uL (ref 4.0–10.5)
nRBC: 0 % (ref 0.0–0.2)

## 2018-11-25 LAB — COMPREHENSIVE METABOLIC PANEL
ALT: 63 U/L — ABNORMAL HIGH (ref 0–44)
AST: 61 U/L — ABNORMAL HIGH (ref 15–41)
Albumin: 3.2 g/dL — ABNORMAL LOW (ref 3.5–5.0)
Alkaline Phosphatase: 71 U/L (ref 38–126)
Anion gap: 9 (ref 5–15)
BUN: 27 mg/dL — ABNORMAL HIGH (ref 6–20)
CO2: 25 mmol/L (ref 22–32)
Calcium: 8.2 mg/dL — ABNORMAL LOW (ref 8.9–10.3)
Chloride: 103 mmol/L (ref 98–111)
Creatinine, Ser: 1.29 mg/dL — ABNORMAL HIGH (ref 0.61–1.24)
GFR calc Af Amer: >60 mL/min (ref >60)
GFR calc non Af Amer: >60 mL/min (ref >60)
Glucose, Bld: 121 mg/dL — ABNORMAL HIGH (ref 70–99)
Potassium: 3.8 mmol/L (ref 3.5–5.1)
Sodium: 137 mmol/L (ref 135–145)
Total Bilirubin: 2.5 mg/dL — ABNORMAL HIGH (ref 0.3–1.2)
Total Protein: 5.7 g/dL — ABNORMAL LOW (ref 6.5–8.1)

## 2018-11-25 LAB — URINALYSIS, ROUTINE W REFLEX MICROSCOPIC
Bilirubin Urine: NEGATIVE
Glucose, UA: NEGATIVE mg/dL
Hgb urine dipstick: NEGATIVE
Ketones, ur: 5 mg/dL — AB
Leukocytes,Ua: NEGATIVE
Nitrite: NEGATIVE
Protein, ur: NEGATIVE mg/dL
Specific Gravity, Urine: 1.024 (ref 1.005–1.030)
pH: 5 (ref 5.0–8.0)

## 2018-11-25 LAB — BASIC METABOLIC PANEL
Anion gap: 8 (ref 5–15)
BUN: 25 mg/dL — ABNORMAL HIGH (ref 6–20)
CO2: 26 mmol/L (ref 22–32)
Calcium: 8.2 mg/dL — ABNORMAL LOW (ref 8.9–10.3)
Chloride: 105 mmol/L (ref 98–111)
Creatinine, Ser: 1.01 mg/dL (ref 0.61–1.24)
GFR calc Af Amer: >60 mL/min (ref >60)
GFR calc non Af Amer: >60 mL/min (ref >60)
Glucose, Bld: 156 mg/dL — ABNORMAL HIGH (ref 70–99)
Potassium: 4.2 mmol/L (ref 3.5–5.1)
Sodium: 139 mmol/L (ref 135–145)

## 2018-11-25 LAB — CBC
HCT: 29.5 % — ABNORMAL LOW (ref 39.0–52.0)
Hemoglobin: 10.3 g/dL — ABNORMAL LOW (ref 13.0–17.0)
MCH: 33.8 pg (ref 26.0–34.0)
MCHC: 34.9 g/dL (ref 30.0–36.0)
MCV: 96.7 fL (ref 80.0–100.0)
Platelets: 235 10*3/uL (ref 150–400)
RBC: 3.05 MIL/uL — ABNORMAL LOW (ref 4.22–5.81)
RDW: 11.5 % (ref 11.5–15.5)
WBC: 9.1 10*3/uL (ref 4.0–10.5)
nRBC: 0 % (ref 0.0–0.2)

## 2018-11-25 LAB — LACTIC ACID, PLASMA: Lactic Acid, Venous: 0.8 mmol/L (ref 0.5–1.9)

## 2018-11-25 MED ORDER — OXYCODONE HCL 5 MG PO TABS: 5.0000 mg | ORAL_TABLET | Freq: Four times a day (QID) | ORAL | 0 refills | Status: DC | PRN

## 2018-11-25 MED ORDER — LISINOPRIL 10 MG PO TABS: 10.0000 mg | ORAL_TABLET | Freq: Every day | ORAL | 0 refills | Status: AC

## 2018-11-25 MED ORDER — ACETAMINOPHEN 325 MG PO TABS
650.0000 mg | ORAL_TABLET | Freq: Once | ORAL | Status: AC | PRN
  Administered 2018-11-25: 22:00:00 650 mg via ORAL
  Filled 2018-11-25: qty 2

## 2018-11-25 MED ORDER — ASPIRIN 81 MG PO CHEW: 81.0000 mg | CHEWABLE_TABLET | Freq: Two times a day (BID) | ORAL | 0 refills | Status: AC

## 2018-11-25 MED ORDER — METHOCARBAMOL 500 MG PO TABS: 500.0000 mg | ORAL_TABLET | Freq: Four times a day (QID) | ORAL | 0 refills | Status: DC | PRN

## 2018-11-25 MED ORDER — SODIUM CHLORIDE 0.9% FLUSH: 3.0000 mL | Freq: Once | INTRAVENOUS | Status: DC
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
## 2018-11-25 NOTE — ED Notes (Signed)
Pt called for treatment room x 4, no answer.

## 2018-11-25 NOTE — ED Triage Notes (Signed)
Pt states he was discharged from hospital today after R hip replacement.  C/o fever since being discharged.

## 2018-11-25 NOTE — Plan of Care (Signed)
  Problem: Education: Goal: Knowledge of General Education information will improve Description: Including pain rating scale, medication(s)/side effects and non-pharmacologic comfort measures Outcome: Completed/Met   Problem: Health Behavior/Discharge Planning: Goal: Ability to manage health-related needs will improve Outcome: Completed/Met   Problem: Clinical Measurements: Goal: Ability to maintain clinical measurements within normal limits will improve Outcome: Completed/Met Goal: Will remain free from infection Outcome: Completed/Met Goal: Diagnostic test results will improve Outcome: Completed/Met Goal: Respiratory complications will improve Outcome: Completed/Met Goal: Cardiovascular complication will be avoided Outcome: Completed/Met   Problem: Activity: Goal: Risk for activity intolerance will decrease Outcome: Completed/Met   Problem: Nutrition: Goal: Adequate nutrition will be maintained Outcome: Completed/Met   Problem: Coping: Goal: Level of anxiety will decrease Outcome: Completed/Met   Problem: Elimination: Goal: Will not experience complications related to bowel motility Outcome: Completed/Met Goal: Will not experience complications related to urinary retention Outcome: Completed/Met   Problem: Pain Managment: Goal: General experience of comfort will improve Outcome: Completed/Met   Problem: Safety: Goal: Ability to remain free from injury will improve Outcome: Completed/Met   Problem: Skin Integrity: Goal: Risk for impaired skin integrity will decrease Outcome: Completed/Met   Problem: Acute Rehab PT Goals(only PT should resolve) Goal: Pt Will Ambulate Outcome: Completed/Met Goal: Pt Will Go Up/Down Stairs Outcome: Completed/Met Goal: Pt/caregiver will Perform Home Exercise Program Outcome: Completed/Met  Discharge instructions reviewed with patient.  These included, but were not limited to, the following:  prescriptions, MD follow up  appointments, when to call the MD, incision care, prevention of infection, activity limitations and exercises, etc.

## 2018-11-25 NOTE — TOC Transition Note (Signed)
Transition of Care [facility]) - CM/SW Discharge Note   Patient Details  Name: Willie Munoz MRN: [id number] Date of Birth: May 27, 1965  [facility]) CM/SW Contact:  Claudie Leach, RN Phone Number: 11/25/2018, 1:18 PM     Final next level of care: Home/Self Care      Discharge Plan and Services                DME Arranged: Gilford Rile rolling DME Agency: [employer] Date DME Agency Contacted: 11/25/18 Time DME Agency Contacted: [phone]      RW to be delivered to room prior to d/c.
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
## 2018-11-25 NOTE — Progress Notes (Signed)
Patient discharged to private residence accompanied by friend via private vehicle.  Escorted to exit via wheelchair accompanied by nurse tech.
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
## 2018-11-25 NOTE — Progress Notes (Signed)
Patient states is wanting to be discharged today.  States pain is minimal and is able to ambulate with minimal assist.

## 2018-11-25 NOTE — Discharge Summary (Signed)
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 106 105  CO2 23  --  GLUCOSE 139* 136* 166* 119* 156*  BUN 27* 29* 26* 23* 25*  CREATININE 0.87 0.80 1.25* 1.00 1.01  CALCIUM 8.9  --  8.6* 8.3* 8.2*   Liver Function Tests: Recent Labs  Lab 11/22/18 0957 11/23/18 0347 11/24/18 0226  AST ALT ALKPHOS 69 69 63  BILITOT 2.0* 1.8* 1.9*  PROT 6.3* 5.8* 5.7*  ALBUMIN 3.9 3.5 3.2*   No results for input(s): LIPASE, AMYLASE in the last 168 hours. No results for input(s): AMMONIA in the last 168 hours. CBC: Recent Labs  Lab 11/22/18 0957 11/22/18 1002 11/23/18 0347 11/24/18 0226 11/25/18 0322  WBC 5.0  --  12.1* 7.8 9.1  HGB 15.0 14.3 13.1 12.8* 10.3*  HCT 42.0 42.0 37.7* 36.2* 29.5*  MCV 94.6  --  94.5 95.3 96.7  PLT 296  --  278 248 235   Cardiac Enzymes: No results for input(s): CKTOTAL, CKMB, CKMBINDEX, TROPONINI in the last 168  hours. BNP: Invalid input(s): POCBNP CBG: No results for input(s): GLUCAP in the last 168 hours. D-Dimer No results for input(s): DDIMER in the last 72 hours. Hgb A1c Recent Labs    11/24/18 0226  HGBA1C 5.3   Lipid Profile No results for input(s): CHOL, HDL, LDLCALC, TRIG, CHOLHDL, LDLDIRECT in the last 72 hours. Thyroid function studies No results for input(s): TSH, T4TOTAL, T3FREE, THYROIDAB in the last 72 hours.  Invalid input(s): FREET3 Anemia work up No results for input(s): VITAMINB12, FOLATE, FERRITIN, TIBC, IRON, RETICCTPCT in the last 72 hours. Urinalysis    Component Value Date/Time   COLORURINE YELLOW 11/22/2018 1240   APPEARANCEUR CLEAR 11/22/2018 1240   LABSPEC 1.039 (H) 11/22/2018 1240   PHURINE 6.0 11/22/2018 1240   GLUCOSEU NEGATIVE 11/22/2018 1240   HGBUR NEGATIVE 11/22/2018 1240   BILIRUBINUR NEGATIVE 11/22/2018 1240   KETONESUR NEGATIVE 11/22/2018 1240   PROTEINUR NEGATIVE 11/22/2018 1240   NITRITE NEGATIVE 11/22/2018 1240   LEUKOCYTESUR NEGATIVE 11/22/2018 1240   Sepsis Labs Invalid input(s): PROCALCITONIN,  WBC,  LACTICIDVEN Microbiology Recent Results (from the past 240 hour(s))  SARS Coronavirus 2 by RT  PCR (hospital order, performed in [facility] hospital lab) Nasopharyngeal Nasopharyngeal Swab     Status: None   Collection Time: 11/22/18 11:12 AM   Specimen: Nasopharyngeal Swab  Result Value Ref Range Status   SARS Coronavirus 2 NEGATIVE NEGATIVE Final    Comment: (NOTE) If result is NEGATIVE SARS-CoV-2 target nucleic acids are NOT DETECTED. The SARS-CoV-2 RNA is generally detectable in upper and lower  respiratory specimens during the acute phase of infection. The lowest  concentration of SARS-CoV-2 viral copies this assay can detect is 250  copies / mL. A negative result does not preclude SARS-CoV-2 infection  and should not be used as the sole basis for treatment or other  patient management decisions.  A negative result may occur  with  improper specimen collection / handling, submission of specimen other  than nasopharyngeal swab, presence of viral mutation(s) within the  areas targeted by this assay, and inadequate number of viral copies  (<250 copies / mL). A negative result must be combined with clinical  observations, patient history, and epidemiological information. If result is POSITIVE SARS-CoV-2 target nucleic acids are DETECTED. The SARS-CoV-2 RNA is generally detectable in upper and lower  respiratory specimens dur ing the acute phase of infection.  Positive  results are indicative of active infection with SARS-CoV-2.  Clinical  correlation with patient history and other diagnostic information is  necessary to determine patient infection status.  Positive results do  not rule out bacterial infection or co-infection with other viruses. If result is PRESUMPTIVE POSTIVE SARS-CoV-2 nucleic acids MAY BE PRESENT.   A presumptive positive result was obtained on the submitted specimen  and confirmed on repeat testing.  While 2019 novel coronavirus  (SARS-CoV-2) nucleic acids may be present in the submitted sample  additional confirmatory testing may be necessary for epidemiological  and / or clinical management purposes  to differentiate between  SARS-CoV-2 and other Sarbecovirus currently known to infect humans.  If clinically indicated additional testing with an alternate test  methodology [phone]) is advised. The SARS-CoV-2 RNA is generally  detectable in upper and lower respiratory sp ecimens during the acute  phase of infection. The expected result is Negative. Fact Sheet for Patients:  BoilerBrush.com.cy Fact Sheet for Healthcare Providers: https://pope.com/ This test is not yet approved or cleared by the [place] FDA and has been authorized for detection and/or diagnosis of SARS-CoV-2 by FDA under an Emergency Use Authorization (EUA).  This EUA  will remain in effect (meaning this test can be used) for the duration of the COVID-19 declaration under Section 564(b)(1) of the Act, 21 U.S.C. section 360bbb-3(b)(1), unless the authorization is terminated or revoked sooner. Performed at [facility], [address] [place], [place] [address]   Surgical pcr screen     Status: Abnormal   Collection Time: 11/23/18  1:03 AM   Specimen: Nasal Mucosa; Nasal Swab  Result Value Ref Range Status   MRSA, PCR NEGATIVE NEGATIVE Final   Staphylococcus aureus POSITIVE (A) NEGATIVE Final    Comment: (NOTE) The Xpert SA Assay (FDA approved for NASAL specimens in patients 12 years of age and older), is one component of a comprehensive surveillance program. It is not intended to diagnose infection nor to guide or monitor treatment. Performed at [facility], [address] [place], [place] [address]    Time spent: 30 min  SIGNED:   Rickey Barbara, MD  [facility] 11/25/2018, 1:04 PM  If 7PM-7AM, please contact night-coverage

## 2018-11-27 ENCOUNTER — Encounter (HOSPITAL_COMMUNITY): Payer: Self-pay | Admitting: Orthopedic Surgery

## 2018-12-04 ENCOUNTER — Encounter (HOSPITAL_COMMUNITY): Payer: Self-pay | Admitting: Orthopedic Surgery

## 2018-12-04 MED ORDER — IRRISEPT - 450ML BOTTLE WITH 0.05% CHG IN STERILE WATER, USP 99.95% OPTIME
TOPICAL | Status: DC | PRN
  Administered 2018-12-04: 900 mL

## 2019-04-14 ENCOUNTER — Other Ambulatory Visit: Payer: Self-pay

## 2019-04-14 ENCOUNTER — Ambulatory Visit
Admission: EM | Admit: 2019-04-14 | Discharge: 2019-04-14 | Disposition: A | Discharge status: Home / Self Care | Payer: BC Managed Care – PPO | Attending: Emergency Medicine | Admitting: Emergency Medicine

## 2019-04-14 ENCOUNTER — Encounter: Payer: Self-pay | Admitting: Emergency Medicine

## 2019-04-14 ENCOUNTER — Ambulatory Visit (INDEPENDENT_AMBULATORY_CARE_PROVIDER_SITE_OTHER): Payer: BC Managed Care – PPO

## 2019-04-14 DIAGNOSIS — M25562 Pain in left knee: Secondary | ICD-10-CM

## 2019-04-14 DIAGNOSIS — M1712 Unilateral primary osteoarthritis, left knee: Secondary | ICD-10-CM | POA: Diagnosis not present

## 2019-04-14 DIAGNOSIS — I10 Essential (primary) hypertension: Secondary | ICD-10-CM | POA: Diagnosis not present

## 2019-04-14 MED ORDER — METHOCARBAMOL 500 MG PO TABS: 500.0000 mg | ORAL_TABLET | Freq: Three times a day (TID) | ORAL | 0 refills | Status: AC | PRN

## 2019-04-14 MED ORDER — NAPROXEN 500 MG PO TABS: 500.0000 mg | ORAL_TABLET | Freq: Two times a day (BID) | ORAL | 0 refills | Status: AC

## 2019-04-14 NOTE — ED Notes (Signed)
Patient able to ambulate independently with cane  

## 2019-04-14 NOTE — Discharge Instructions (Addendum)
Recommend RICE: rest, ice, compression, elevation as needed for pain.    Heat therapy (hot compress, warm wash red, hot showers, etc.) can help relax muscles and soothe muscle aches. Cold therapy (ice packs) can be used to help swelling both after injury and after prolonged use of areas of chronic pain/aches.  For pain: Take naproxen and Robaxin as directed.  May take Tylenol on top of this if needed.  May take muscle relaxer as needed for severe pain / spasm.  (This medication may cause you to become tired so it is important you do not drink alcohol or operate heavy machinery while on this medication.  Recommend your first dose to be taken before bedtime to monitor for side effects safely)

## 2019-04-14 NOTE — ED Triage Notes (Signed)
Pt presents to [facility] for assessment after twisting his left knee last night playing ping pong.  When asked where the pain is, patient states "the whole joint".
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
## 2021-07-22 IMAGING — DX DG KNEE AP/LAT W/ SUNRISE*L*
3 series · 3 of 3 positions shown · non-contrast
Comparison: None.

CLINICAL DATA: Twisting injury yesterday, pain

EXAM:
LEFT KNEE 3 VIEWS

[knee ap]
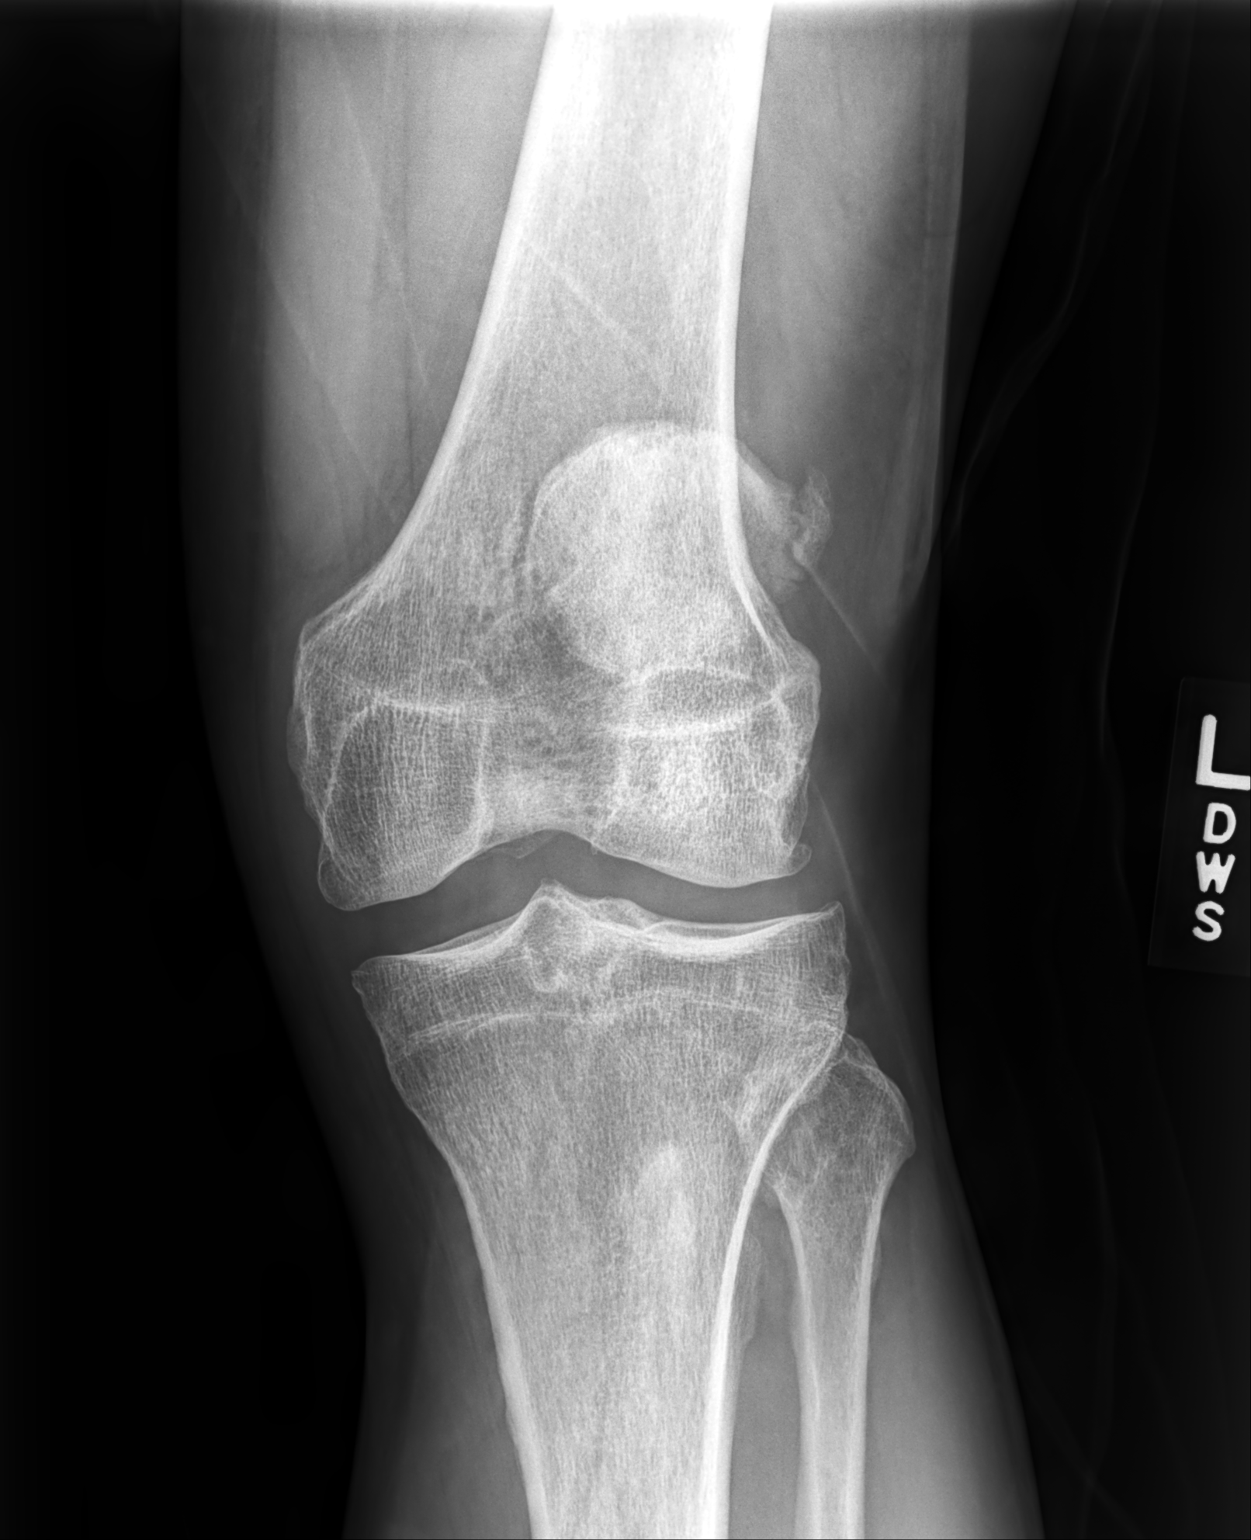

[knee lat]
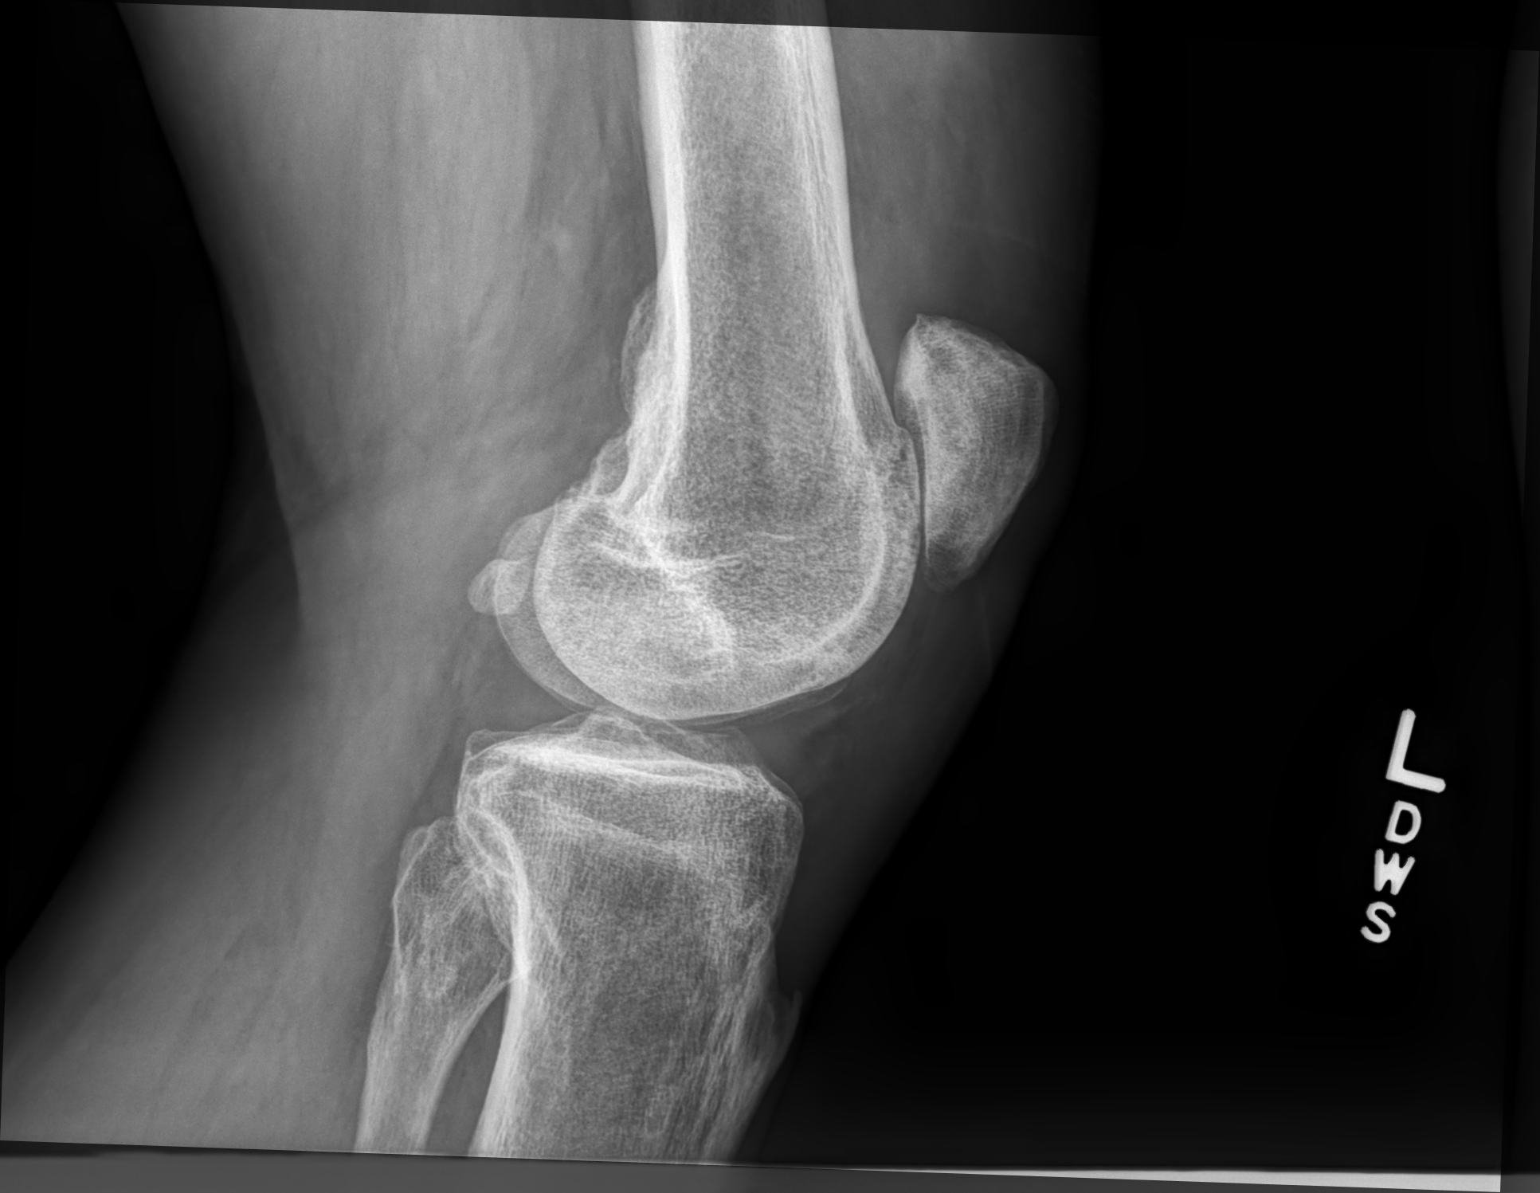

[patella [id]]
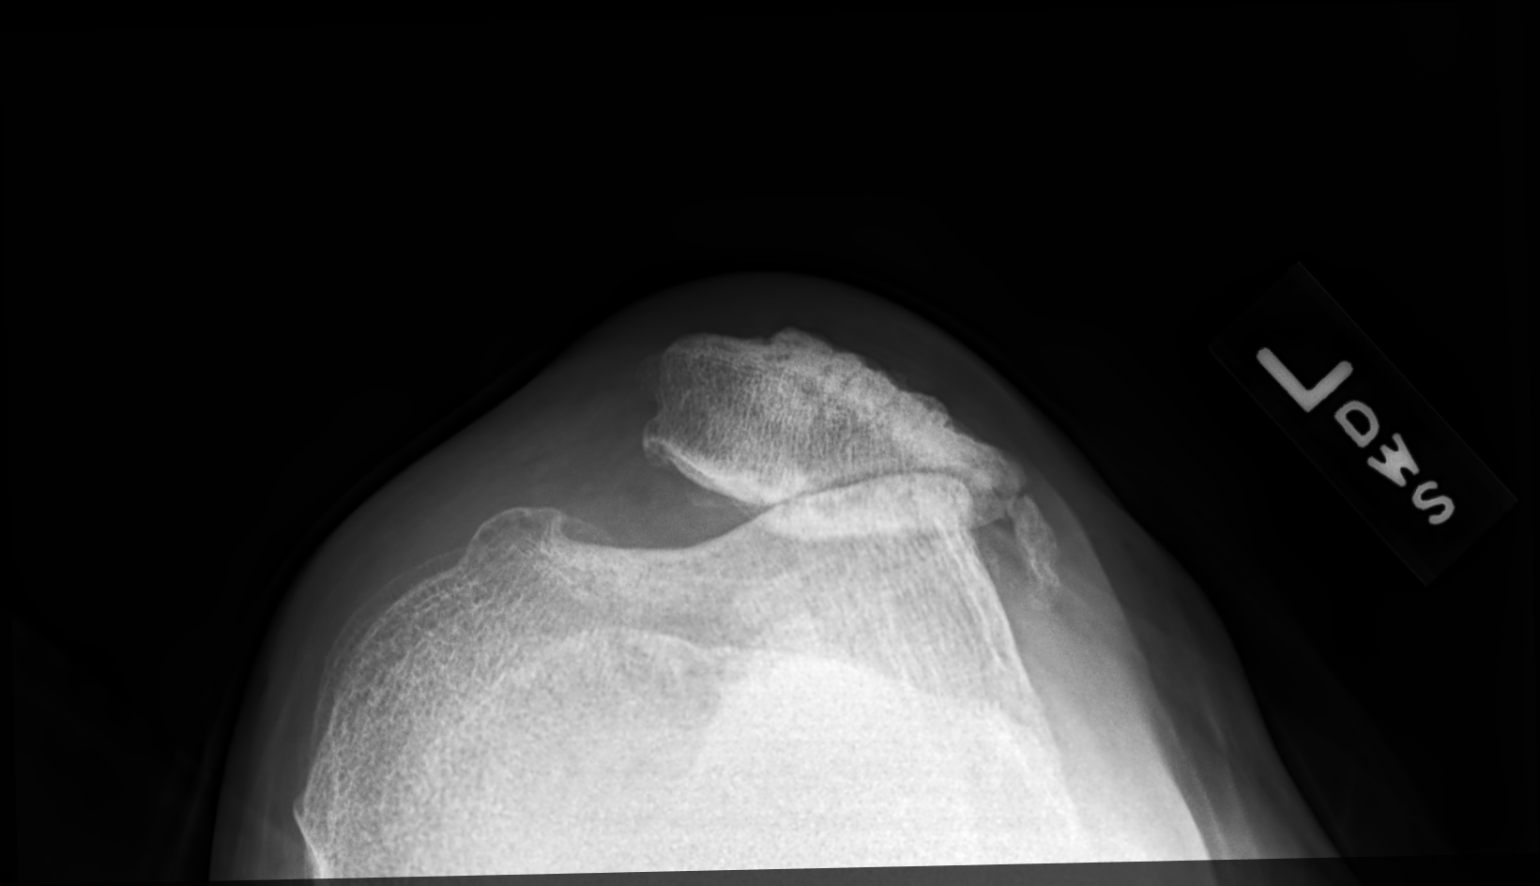

[3 of 3 positions shown; findings below may reference images not displayed]

FINDINGS: No fracture or dislocation of the left knee. Mild tricompartmental
joint space narrowing and osteophytosis. No significant knee joint
effusion. Incidental note of bipartite ossification of the patella
and patella alta. Soft tissues are unremarkable.
IMPRESSION: 1. No fracture or dislocation of the left knee.
2. Mild tricompartmental osteoarthritis.

## 2021-09-08 ENCOUNTER — Other Ambulatory Visit: Payer: Self-pay

## 2021-09-08 ENCOUNTER — Ambulatory Visit (HOSPITAL_COMMUNITY)
Admission: EM | Admit: 2021-09-08 | Discharge: 2021-09-08 | Disposition: A | Discharge status: Home / Self Care | Payer: BC Managed Care – PPO | Attending: Family Medicine | Admitting: Family Medicine

## 2021-09-08 ENCOUNTER — Encounter (HOSPITAL_COMMUNITY): Payer: Self-pay | Admitting: *Deleted

## 2021-09-08 DIAGNOSIS — W5501XA Bitten by cat, initial encounter: Secondary | ICD-10-CM

## 2021-09-08 DIAGNOSIS — M79671 Pain in right foot: Secondary | ICD-10-CM

## 2021-09-08 DIAGNOSIS — L03115 Cellulitis of right lower limb: Secondary | ICD-10-CM | POA: Diagnosis not present

## 2021-09-08 DIAGNOSIS — L03119 Cellulitis of unspecified part of limb: Secondary | ICD-10-CM

## 2021-09-08 MED ORDER — AMOXICILLIN-POT CLAVULANATE 875-125 MG PO TABS: 1.0000 | ORAL_TABLET | Freq: Two times a day (BID) | ORAL | 0 refills | Status: AC

## 2021-09-08 NOTE — ED Triage Notes (Signed)
PT reports he stepped on his Cats paw and the cat bite him on the  RT foot. Two bite marks seen on top of foot.

## 2021-09-09 NOTE — ED Provider Notes (Signed)
[facility]   [id number] 09/08/21 Arrival Time: 1720  ASSESSMENT & PLAN:  1. Cellulitis of foot   2. Right foot pain   3. Cat bite, initial encounter    No indication for IV antibiotics at this time. Discharge Medication List as of 09/08/2021  6:31 PM     START taking these medications   Details  amoxicillin-clavulanate (AUGMENTIN) 875-125 MG tablet Take 1 tablet by mouth every 12 (twelve) hours., Starting Tue 09/08/2021, Normal       Close observation over next 24-48 hours. Declines Tdap.  Recommend:  Follow-up Information     [facility] EMERGENCY DEPARTMENT.   Specialty: Emergency Medicine Why: If worsening or failing to improve as anticipated. Contact information: [address] [id number] mc [place] [address] [phone]                 Reviewed expectations re: course of current medical issues. Questions answered. Outlined signs and symptoms indicating need for more acute intervention. Patient verbalized understanding. After Visit Summary given.  SUBJECTIVE: History from: patient. Willie Munoz is a 56 y.o. male who reports cat bite to R foot; yesterday. Accident. Now foot is swollen/red. Able to bear wt with pain. No extremity sensation changes or weakness. Afebrile. Feels Td is not UTD.   OBJECTIVE:  Vitals:   09/08/21 1751  BP: (!) 169/101  Pulse: (!) 59  Resp: 18  Temp: 98.6 F (37 C)  SpO2: 98%    General appearance: alert; no distress HEENT: [place]; AT Neck: supple with FROM Resp: unlabored respirations Extremities: RLE: warm with well perfused appearance; fairly well localized marked tenderness over right dorsal mid foot with two puncture wounds; surrounding erythema that is very warm to touch; swelling: moderate; bruising: none; ankle and all toes with FROM CV: brisk extremity capillary refill of RLE; 2+ DP pulse of RLE. Skin: warm and dry; no visible rashes Neurologic: gait normal;  normal sensation and strength of RLE Psychological: alert and cooperative; normal mood and affect   Allergies  Allergen Reactions   Other Itching    Ragweed: sneezing    Penicillins Other (See Comments)    Did it involve swelling of the face/tongue/throat, SOB, or low BP? Unknown Did it involve sudden or severe rash/hives, skin peeling, or any reaction on the inside of your mouth or nose? Unknown Did you need to seek medical attention at a hospital or doctor's office? Unknown When did it last happen?    pt was a child    If all above answers are "NO", may proceed with cephalosporin use.     Past Medical History:  Diagnosis Date   Tobacco use    Social History   Socioeconomic History   Marital status: Married    Spouse name: Not on file   Number of children: Not on file   Years of education: Not on file   Highest education level: Not on file  Occupational History   Not on file  Tobacco Use   Smoking status: Every Day    Packs/day: 0.50    Types: Cigarettes   Smokeless tobacco: Never  Substance and Sexual Activity   Alcohol use: Never   Drug use: Never   Sexual activity: Not on file  Other Topics Concern   Not on file  Social History Narrative   Not on file   Social Determinants of Health   Financial Resource Strain: Not on file  Food Insecurity: Not on file  Transportation Needs:  Not on file  Physical Activity: Not on file  Stress: Not on file  Social Connections: Not on file   Family History  Problem Relation Age of Onset   ALS Father    Alzheimer's disease Father    Past Surgical History:  Procedure Laterality Date   HIP CLOSED REDUCTION Right 11/22/2018   Procedure: CLOSED REDUCTION HIP WITH TRACTION PIN PLACEMENT;  Surgeon: Roby Lofts, MD;  Location: [facility] OR;  Service: Orthopedics;  Laterality: Right;   KNEE ARTHROSCOPY     REPLACEMENT TOTAL KNEE     TOTAL HIP ARTHROPLASTY Right 11/24/2018   Procedure: TOTAL HIP ARTHROPLASTY ANTERIOR APPROACH;   Surgeon: Samson Frederic, MD;  Location: [facility] OR;  Service: Orthopedics;  Laterality: Right;       Mardella Layman, MD 09/09/21 [phone]
